# Patient Record
Sex: Male | Born: 2000 | Race: White | Hispanic: No | Marital: Single | Smoking: Never smoker
Health system: Southern US, Community
[De-identification: ages and names within clinical notes are randomized; demographics above are authoritative.]

## PROBLEM LIST (undated history)

## (undated) DIAGNOSIS — N200 Calculus of kidney: Secondary | ICD-10-CM

## (undated) DIAGNOSIS — F845 Asperger's syndrome: Secondary | ICD-10-CM

## (undated) HISTORY — PX: ATRIAL FIBRILLATION ABLATION: SHX5732

## (undated) HISTORY — DX: Asperger's syndrome: F84.5

## (undated) HISTORY — DX: Calculus of kidney: N20.0

---
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
## 2019-12-10 ENCOUNTER — Telehealth: Payer: Self-pay

## 2019-12-10 ENCOUNTER — Other Ambulatory Visit: Payer: Self-pay

## 2019-12-10 MED ORDER — ADAPALENE-BENZOYL PEROXIDE 0.1-2.5 % EX GEL: 1.0000 "application " | Freq: Every day | CUTANEOUS | 2 refills | Status: DC

## 2019-12-10 NOTE — Telephone Encounter (Signed)
Pt mom calling requesting refill of Epiduo Forte, patient away in college and he will be here for an appt in June 2021

## 2019-12-12 ENCOUNTER — Other Ambulatory Visit: Payer: Self-pay

## 2019-12-12 MED ORDER — EPIDUO FORTE 0.3-2.5 % EX GEL: 1.0000 "application " | Freq: Every day | CUTANEOUS | 3 refills | Status: DC

## 2020-03-09 ENCOUNTER — Ambulatory Visit: Payer: BLUE CROSS/BLUE SHIELD | Admitting: Dermatology

## 2020-03-22 ENCOUNTER — Other Ambulatory Visit: Payer: Self-pay | Admitting: Dermatology

## 2020-04-23 ENCOUNTER — Ambulatory Visit: Payer: BLUE CROSS/BLUE SHIELD | Admitting: Dermatology

## 2021-04-06 ENCOUNTER — Other Ambulatory Visit: Payer: Self-pay

## 2021-04-06 DIAGNOSIS — R109 Unspecified abdominal pain: Secondary | ICD-10-CM

## 2021-04-06 DIAGNOSIS — Z87442 Personal history of urinary calculi: Secondary | ICD-10-CM

## 2021-04-07 ENCOUNTER — Ambulatory Visit (INDEPENDENT_AMBULATORY_CARE_PROVIDER_SITE_OTHER): Payer: BC Managed Care – PPO | Admitting: Urology

## 2021-04-07 ENCOUNTER — Ambulatory Visit: Payer: Self-pay | Admitting: Urology

## 2021-04-07 ENCOUNTER — Telehealth: Payer: Self-pay | Admitting: Urology

## 2021-04-07 ENCOUNTER — Encounter: Payer: Self-pay | Admitting: Urology

## 2021-04-07 ENCOUNTER — Ambulatory Visit
Admission: RE | Admit: 2021-04-07 | Discharge: 2021-04-07 | Disposition: A | Discharge status: Home / Self Care | Payer: BC Managed Care – PPO | Source: Ambulatory Visit | Attending: Urology | Admitting: Urology

## 2021-04-07 ENCOUNTER — Other Ambulatory Visit: Payer: Self-pay

## 2021-04-07 VITALS — BP 121/82 | HR 81 | Ht 72.0 in | Wt 155.0 lb

## 2021-04-07 DIAGNOSIS — R1084 Generalized abdominal pain: Secondary | ICD-10-CM

## 2021-04-07 DIAGNOSIS — R109 Unspecified abdominal pain: Secondary | ICD-10-CM | POA: Diagnosis present

## 2021-04-07 DIAGNOSIS — Z87442 Personal history of urinary calculi: Secondary | ICD-10-CM

## 2021-04-07 LAB — URINALYSIS, COMPLETE
Bilirubin, UA: NEGATIVE
Glucose, UA: NEGATIVE
Ketones, UA: NEGATIVE
Leukocytes,UA: NEGATIVE
Nitrite, UA: NEGATIVE
Specific Gravity, UA: 1.03 (ref 1.005–1.030)
Urobilinogen, Ur: 0.2 mg/dL (ref 0.2–1.0)
pH, UA: 6 (ref 5.0–7.5)

## 2021-04-07 LAB — MICROSCOPIC EXAMINATION: RBC, Urine: >30 /hpf — AB (ref 0–2)

## 2021-04-07 NOTE — Telephone Encounter (Signed)
Pt's mother LMOM that pt saw Dr Valentina Lucks w/[facility] 3 yrs after stopping [facility].  She said she may have the number if we want to get records from them.

## 2021-04-07 NOTE — Progress Notes (Signed)
04/07/2021 1:25 PM   Richard Ball 2000-10-06 [id number]  Referring provider: No referring provider defined for this encounter.  Chief Complaint  Patient presents with   Hematuria    New Patient: hx of stones    HPI: 20 year old male who presents today with his mother for cute onset gross hematuria yesterday.  Notably, the patient has a complex urologic history as a pediatric patient.  At age 70, he had developed severe acute flank pain was found to have at least 4 kidney stones.  He was told that they were all small enough to pass but had one much larger.  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  He was followed for about 3 years and then ultimately released from urologic care.  His urologic history is somewhat unclear and records have been requested today from [facility] in [place] for further clarification.  Since then, he has had no further issues until yesterday.  Yesterday, he had blood-tinged urine on several occasions.  He may have had some associated flank pain, primarily right greater than left but this is very subtle.  He is currently in no acute distress.  No nausea or vomiting.  No fevers or chills.  No dysuria.   PMH: SVT Kidney stones  Surgical History: Cardiac ablation  Home Medications:  Allergies as of 04/07/2021   No Known Allergies      Medication List        Accurate as of April 07, 2021  1:25 PM. If you have any questions, ask your nurse or doctor.          STOP taking these medications    doxycycline 50 MG tablet Commonly known as: ADOXA Stopped by: Vanna Scotland, MD   Epiduo Forte 0.3-2.5 % Gel Generic drug: Adapalene-Benzoyl Peroxide Stopped by: Vanna Scotland, MD        Allergies: No Known Allergies  Family History: No family history on file.  Social History:  reports that he has never smoked. He has never used smokeless tobacco. He reports  previous alcohol use. No history on file for drug use.   Physical Exam: BP 121/82   Pulse 81   Ht 6' (1.829 m)   Wt 155 lb (70.3 kg)   BMI 21.02 kg/m   Constitutional:  Alert and oriented, No acute distress.  Accompanied by his mother today. HEENT: [place] AT, moist mucus membranes.  Trachea midline, no masses. Cardiovascular: No clubbing, cyanosis, or edema. Respiratory: Normal respiratory effort, no increased work of breathing. Skin: No rashes, bruises or suspicious lesions. Neurologic: Grossly intact, no focal deficits, moving all 4 extremities. Psychiatric: Normal mood and affect.    Urinalysis Urinalysis today with 6-10 WBCs, greater than 30 RBCs, moderate bacteria, protein but otherwise unremarkable.  Pertinent Imaging: KUB was ordered and personally reviewed today.  Radiologic interpretation is pending.  There is a spherical calcification overlying the right renal shadow which is very round in nature which may represent possibly a stone within a diverticulum or possibly within a calyx with stenotic infundibulum based on its appearance, alternatively could be outside of the kidney.  No ureteral, left-sided or pelvic calcifications appreciated.  Assessment & Plan:    1. History of kidney stones/ kidney stone Personal history of kidney stones at a young age, suspect some underlying renal anomaly possibly UPJ obstruction or calyceal diverticulum based on his mother's description  There is a calcification overlying his right  kidney which could possibly represent a stone within a diverticulum given his very round nature as well as location  Given these having gross hematuria, there is also possibility this having an acute stone event with a smaller stone not seen on KUB  At this point time after lengthy discussion, we did elect to go ahead and proceed with noncontrast CT scan.  Explained he is relatively young and would like to avoid serial CTs however this would help  establish/elucidate his anatomy as well as whether or not this represents a stone if he has another acute stone causing his gross hematuria event  Warning symptoms reviewed in the interim and indications for more urgent/emergent evaluation  We will call him with the CT results and follow-up plan thereafter  Lastly, records requested as outlined above - Urinalysis, Complete - CULTURE, URINE COMPREHENSIVE  2. Flank pain  Possibly secondary to #2; mild - CT RENAL STONE STUDY; Future  3. Gross hematuria Will send culture to rule out infection although not suspected  Likely secondary to #1  Will call with STAT CT results  Vanna Scotland, MD  [facility] [address] [place], [place] [address] [phone]

## 2021-04-08 ENCOUNTER — Telehealth: Payer: Self-pay | Admitting: Urology

## 2021-04-08 ENCOUNTER — Ambulatory Visit
Admission: RE | Admit: 2021-04-08 | Discharge: 2021-04-08 | Disposition: A | Discharge status: Home / Self Care | Payer: BC Managed Care – PPO | Source: Ambulatory Visit | Attending: Urology | Admitting: Urology

## 2021-04-08 DIAGNOSIS — R1084 Generalized abdominal pain: Secondary | ICD-10-CM | POA: Insufficient documentation

## 2021-04-08 NOTE — Telephone Encounter (Signed)
Patient's mother informed, scheduled follow up. Voiced understanding.

## 2021-04-08 NOTE — Telephone Encounter (Signed)
Confirm that his mother is on the [place].  Call or discuss CT scan findings.  Appears that he is passing interval left-sided 3 mm stone and this is likely the source of his hematuria.  Is minimal pain.  As such, would recommend conservative management.  He is got a stop by and pick up a urinary strainer either today or tomorrow.  Please arrange follow-up with me in 2 weeks for another x-ray as we can track the stone.  We discussed that it is important to try to catch the stone and send it for analysis, the stone is fairly dense at 1500 [place] units?  Cystinuria.  Vanna Scotland, MD

## 2021-04-08 NOTE — Telephone Encounter (Signed)
Faxed request to [facility]

## 2021-04-08 NOTE — Telephone Encounter (Signed)
Called patient's mother after confirming that she was on the [facility].  Unable to reach her, left message to return my call.  She is aware that I will be out of the office this afternoon and in and out tomorrow.  Vanna Scotland, MD

## 2021-04-08 NOTE — Telephone Encounter (Signed)
Pt's mother called asking if we have any results from CT scan.

## 2021-04-21 NOTE — Telephone Encounter (Signed)
Contacted [facility]-patient has not been seen at this office

## 2021-04-22 ENCOUNTER — Other Ambulatory Visit: Payer: Self-pay

## 2021-04-22 ENCOUNTER — Ambulatory Visit
Admission: RE | Admit: 2021-04-22 | Discharge: 2021-04-22 | Disposition: A | Discharge status: Home / Self Care | Payer: BC Managed Care – PPO | Source: Ambulatory Visit | Attending: Urology | Admitting: Urology

## 2021-04-22 ENCOUNTER — Ambulatory Visit
Admission: RE | Admit: 2021-04-22 | Discharge: 2021-04-22 | Disposition: A | Discharge status: Home / Self Care | Payer: BC Managed Care – PPO | Attending: Urology | Admitting: Urology

## 2021-04-22 ENCOUNTER — Ambulatory Visit (INDEPENDENT_AMBULATORY_CARE_PROVIDER_SITE_OTHER): Payer: BC Managed Care – PPO | Admitting: Urology

## 2021-04-22 ENCOUNTER — Encounter: Payer: Self-pay | Admitting: Urology

## 2021-04-22 VITALS — BP 118/83 | HR 80 | Ht 72.0 in | Wt 155.0 lb

## 2021-04-22 DIAGNOSIS — Z87442 Personal history of urinary calculi: Secondary | ICD-10-CM

## 2021-04-22 DIAGNOSIS — N201 Calculus of ureter: Secondary | ICD-10-CM | POA: Diagnosis not present

## 2021-04-22 DIAGNOSIS — N200 Calculus of kidney: Secondary | ICD-10-CM

## 2021-04-22 NOTE — Progress Notes (Signed)
04/22/2021 11:38 AM   Richard Ball Richard Ball May 07, 2001 [id number]  Referring provider: Tresa Res, MD [phone] [address],  [place] [address]  Chief Complaint  Patient presents with   Nephrolithiasis    HPI: 20 year old male with a personal history of nephrolithiasis who was seen and evaluated 2 weeks ago with acute onset hematuria.  Initially, a large stone was seen overlying the right kidney on KUB.  He was sent for urgent noncontrast CT scan which showed a 1.1 cm calculus overlying the right kidney a 3 mm calcification in the proximal third of the left ureter.  On retrospect, the stone could be seen occultly behind the transverse process on KUB.    Right renal calculus is chronic from his childhood.  He returns today for follow-up KUB.  He did pass a very small fragment but this is less than a millimeter.  He continues to have no pain or obvious gross hematuria.  Urinalysis today does have 2+ blood.   PMH: History reviewed. No pertinent past medical history.  Surgical History: Past Surgical History:  Procedure Laterality Date   ATRIAL FIBRILLATION ABLATION      Home Medications:  Allergies as of 04/22/2021   No Known Allergies      Medication List    as of April 22, 2021 11:38 AM   You have not been prescribed any medications.     Allergies: No Known Allergies  Family History: History reviewed. No pertinent family history.  Social History:  reports that he has never smoked. He has never used smokeless tobacco. He reports previous alcohol use. No history on file for drug use.   Physical Exam: BP 118/83   Pulse 80   Ht 6' (1.829 m)   Wt 155 lb (70.3 kg)   BMI 21.02 kg/m   Constitutional:  Alert and oriented, No acute distress. HEENT: [place] AT, moist mucus membranes.  Trachea midline, no masses. Cardiovascular: No clubbing, cyanosis, or edema. Respiratory: Normal respiratory effort, no increased work of breathing. Skin: No rashes, bruises or  suspicious lesions. Neurologic: Grossly intact, no focal deficits, moving all 4 extremities. Psychiatric: Normal mood and affect.  Pertinent Imaging: KUB from today was personally reviewed.  Final radiology interpretation is pending but the ureteral calculus on the left side in question is not easily seen today.  This was compared to his previous KUB from 2 weeks ago along with CT scan.  Right renal stone is stable.  Assessment & Plan:    1. 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  We will have her return again in 2 weeks with a urinalysis and continue to strain.  Warning symptoms reviewed again in detail. - Urinalysis, Complete  2. Right kidney stone Stable, nonobstructing, chronic  We discussed various treatment options for this including observation.  They prefer observation, would like to get a KUB again in 2 years and then possibly every 5 years thereafter to ensure stability.   F/u 2 weeks with KUB  Vanna Scotland, MD  [facility] [address] [place], [place] [address] [phone]  I spent 30 total minutes on the day of the encounter including pre-visit  review of the medical record, face-to-face time with the patient, and post visit ordering of labs/imaging/tests.

## 2021-04-23 LAB — URINALYSIS, COMPLETE
Bilirubin, UA: NEGATIVE
Glucose, UA: NEGATIVE
Ketones, UA: NEGATIVE
Leukocytes,UA: NEGATIVE
Nitrite, UA: NEGATIVE
Protein,UA: NEGATIVE
Specific Gravity, UA: >1.03 — ABNORMAL HIGH (ref 1.005–1.030)
Urobilinogen, Ur: 0.2 mg/dL (ref 0.2–1.0)
pH, UA: 5.5 (ref 5.0–7.5)

## 2021-04-23 LAB — MICROSCOPIC EXAMINATION: Bacteria, UA: NONE SEEN

## 2021-05-03 ENCOUNTER — Telehealth: Payer: Self-pay

## 2021-05-03 DIAGNOSIS — F845 Asperger's syndrome: Secondary | ICD-10-CM

## 2021-05-03 NOTE — Telephone Encounter (Signed)
Patient mother called stating pt has Asperger and just wanted to give you a "heads up" That he does not feel pain like others do and sometimes can not express his emotions. She states he is coming alone for his follow up and wanted to just let you know and as of today he has not passed his stone.

## 2021-05-04 MED ORDER — HYDROCODONE-ACETAMINOPHEN 5-325 MG PO TABS: 1.0000 | ORAL_TABLET | Freq: Four times a day (QID) | ORAL | 0 refills | Status: DC | PRN

## 2021-05-04 NOTE — Progress Notes (Signed)
05/05/21 9:13 AM   Richard Ball 06-14-2001 [id number]  Referring provider:  Tresa Res, MD [phone] [address],  [place] [address] Chief Complaint  Patient presents with   Nephrolithiasis     HPI: Richard Ball is a 20 y.o.male  who has a personal history of nephrolithiasis and presents today for a 2 week follow-up UA.   His 04/22/2021 KUB showed stable 12 mm right renal calculus. No other urinary tract calculi. The left retroperitoneal calcifications seen on the preceding CT scan was not visualized on this study.  Initially, a large stone was seen overlying the right kidney on KUB.  He was sent for urgent noncontrast CT scan which showed a 1.1 cm calculus overlying the right kidney a 3 mm calcification in the proximal third of the left ureter.  On retrospect, the stone could be seen occultly behind the transverse process on KUB.  Right renal calculus is chronic from his childhood.   Patients xray of abdomen showed  a stone today on his left UVJ.  Patient is accompanied by his mother.   Patients mom states that he has been experiencing nausea and vomiting acute pain for the last 24 hours.  Patient states has been experiencing pain at a scale of 7-8.   Prior to yesterday, had not been having any pain with a stone, only hematuria.    PMH: Past Medical History:  Diagnosis Date   Asperger syndrome    Kidney stones     Surgical History: Past Surgical History:  Procedure Laterality Date   ATRIAL FIBRILLATION ABLATION      Home Medications:  Allergies as of 05/05/2021   No Known Allergies      Medication List        Accurate as of May 05, 2021  9:13 AM. If you have any questions, ask your nurse or doctor.          HYDROcodone-acetaminophen 5-325 MG tablet Commonly known as: NORCO/VICODIN Take 1-2 tablets by mouth every 6 (six) hours as needed for moderate pain.        Allergies: No Known Allergies  Family History: History  reviewed. No pertinent family history.  Social History:  reports that he has never smoked. He has never used smokeless tobacco. He reports previous alcohol use. No history on file for drug use.   Physical Exam: BP 134/86   Pulse 80   Ht 6' (1.829 m)   Wt 155 lb (70.3 kg)   BMI 21.02 kg/m   Constitutional:  Alert and oriented, appears uncomfortable in a wheelchair today.  Distinct change from previous visits. HEENT: [place] AT, moist mucus membranes.  Trachea midline, no masses. Cardiovascular: No clubbing, cyanosis, or edema. Respiratory: Normal respiratory effort, no increased work of breathing. Skin: No rashes, bruises or suspicious lesions. Neurologic: Grossly intact, no focal deficits, moving all 4 extremities. Psychiatric: Normal mood and affect.  Urinalysis Urinalysis today was unremarkable  Pertinent Imaging: KUB personally reviewed today, 3 mm stone is now on the distal ureter/located adjacent to the UVJ.  Assessment & Plan:    Left ureteral stone - Stone is in the left distal ureter - interval progression of the stone in the distal ureter but now its significantly uncontrolled pain.   We discussed various treatment options for urolithiasis including observation with or without medical expulsive therapy, shockwave lithotripsy (SWL), ureteroscopy and laser lithotripsy with stent placement, and percutaneous nephrolithotomy.   We discussed that management is based on stone size, location,  density, patient co-morbidities, and patient preference.    Stones <90mm in size have a >80% spontaneous passage rate. Data surrounding the use of tamsulosin for medical expulsive therapy is controversial, but meta analyses suggests it is most efficacious for distal stones between 5-10mm in size. Possible side effects include dizziness/lightheadedness, and retrograde ejaculation.   SWL has a lower stone free rate in a single procedure, but also a lower complication rate compared to ureteroscopy  and avoids a stent and associated stent related symptoms. Possible complications include renal hematoma, steinstrasse, and need for additional treatment. We discussed the role of his increased skin to stone distance can lead to decreased efficacy with shockwave lithotripsy.   Ureteroscopy with laser lithotripsy and stent placement has a higher stone free rate than SWL in a single procedure, however increased complication rate including possible infection, ureteral injury, bleeding, and stent related morbidity. Common stent related symptoms include dysuria, urgency/frequency, and flank pain.   After an extensive discussion of the risks and benefits of the above treatment options, the patient would like to proceed with left ESWL.   2. Nausea  - He has been experiencing nausea and vomiting, placed him on Zofran   Follow-up with ESWL   I,Kailey Littlejohn,acting as a scribe for Vanna Scotland, MD.,have documented all relevant documentation on the behalf of Vanna Scotland, MD,as directed by  Vanna Scotland, MD while in the presence of Vanna Scotland, MD.   [facility] [address], [address] [place], [place] [address] [phone]

## 2021-05-04 NOTE — Telephone Encounter (Signed)
Patient's mother left message on triage line that he is in a lot of pain and is sitting in her bathtub trying to pass the stone. She is wanting to know if there is anything stronger than Ibuprofen than can be sent in for the pain?

## 2021-05-04 NOTE — Telephone Encounter (Signed)
Prescription for Vicodin was sent to pharmacy  Vanna Scotland, MD

## 2021-05-04 NOTE — Telephone Encounter (Signed)
Patient's mother aware-voiced understanding.

## 2021-05-04 NOTE — H&P (View-Only) (Signed)
 05/05/21 9:13 AM   Richard Ball 06/29/2001 [id number]  Referring provider:  Johnson, David S, MD [address] [place],  [place] [address] Chief Complaint  Patient presents with   Nephrolithiasis     HPI: Richard Ball is a 20 y.o.male  who has a personal history of nephrolithiasis and presents today for a 2 week follow-up UA.   His 04/22/2021 KUB showed stable 12 mm right renal calculus. No other urinary tract calculi. The left retroperitoneal calcifications seen on the preceding CT scan was not visualized on this study.  Initially, a large stone was seen overlying the right kidney on KUB.  He was sent for urgent noncontrast CT scan which showed a 1.1 cm calculus overlying the right kidney a 3 mm calcification in the proximal third of the left ureter.  On retrospect, the stone could be seen occultly behind the transverse process on KUB.  Right renal calculus is chronic from his childhood.   Patients xray of abdomen showed  a stone today on his left UVJ.  Patient is accompanied by his mother.   Patients mom states that he has been experiencing nausea and vomiting acute pain for the last 24 hours.  Patient states has been experiencing pain at a scale of 7-8.   Prior to yesterday, had not been having any pain with a stone, only hematuria.    PMH: Past Medical History:  Diagnosis Date   Asperger syndrome    Kidney stones     Surgical History: Past Surgical History:  Procedure Laterality Date   ATRIAL FIBRILLATION ABLATION      Home Medications:  Allergies as of 05/05/2021   No Known Allergies      Medication List        Accurate as of May 05, 2021  9:13 AM. If you have any questions, ask your nurse or doctor.          HYDROcodone-acetaminophen 5-325 MG tablet Commonly known as: NORCO/VICODIN Take 1-2 tablets by mouth every 6 (six) hours as needed for moderate pain.        Allergies: No Known Allergies  Family History: History  reviewed. No pertinent family history.  Social History:  reports that he has never smoked. He has never used smokeless tobacco. He reports previous alcohol use. No history on file for drug use.   Physical Exam: BP 134/86   Pulse 80   Ht 6' (1.829 m)   Wt 155 lb (70.3 kg)   BMI 21.02 kg/m   Constitutional:  Alert and oriented, appears uncomfortable in a wheelchair today.  Distinct change from previous visits. HEENT:  AT, moist mucus membranes.  Trachea midline, no masses. Cardiovascular: No clubbing, cyanosis, or edema. Respiratory: Normal respiratory effort, no increased work of breathing. Skin: No rashes, bruises or suspicious lesions. Neurologic: Grossly intact, no focal deficits, moving all 4 extremities. Psychiatric: Normal mood and affect.  Urinalysis Urinalysis today was unremarkable  Pertinent Imaging: KUB personally reviewed today, 3 mm stone is now on the distal ureter/located adjacent to the UVJ.  Assessment & Plan:    Left ureteral stone - Stone is in the left distal ureter - interval progression of the stone in the distal ureter but now its significantly uncontrolled pain.   We discussed various treatment options for urolithiasis including observation with or without medical expulsive therapy, shockwave lithotripsy (SWL), ureteroscopy and laser lithotripsy with stent placement, and percutaneous nephrolithotomy.   We discussed that management is based on stone size, location,   density, patient co-morbidities, and patient preference.    Stones <90mm in size have a >80% spontaneous passage rate. Data surrounding the use of tamsulosin for medical expulsive therapy is controversial, but meta analyses suggests it is most efficacious for distal stones between 5-10mm in size. Possible side effects include dizziness/lightheadedness, and retrograde ejaculation.   SWL has a lower stone free rate in a single procedure, but also a lower complication rate compared to ureteroscopy  and avoids a stent and associated stent related symptoms. Possible complications include renal hematoma, steinstrasse, and need for additional treatment. We discussed the role of his increased skin to stone distance can lead to decreased efficacy with shockwave lithotripsy.   Ureteroscopy with laser lithotripsy and stent placement has a higher stone free rate than SWL in a single procedure, however increased complication rate including possible infection, ureteral injury, bleeding, and stent related morbidity. Common stent related symptoms include dysuria, urgency/frequency, and flank pain.   After an extensive discussion of the risks and benefits of the above treatment options, the patient would like to proceed with left ESWL.   2. Nausea  - He has been experiencing nausea and vomiting, placed him on Zofran   Follow-up with ESWL   I,Kailey Littlejohn,acting as a scribe for Vanna Scotland, MD.,have documented all relevant documentation on the behalf of Vanna Scotland, MD,as directed by  Vanna Scotland, MD while in the presence of Vanna Scotland, MD.   [facility] [address], [address] [place], [place] [address] [phone]

## 2021-05-05 ENCOUNTER — Ambulatory Visit
Admission: RE | Admit: 2021-05-05 | Discharge: 2021-05-05 | Disposition: A | Discharge status: Home / Self Care | Payer: BC Managed Care – PPO | Source: Ambulatory Visit | Attending: Urology | Admitting: Urology

## 2021-05-05 ENCOUNTER — Other Ambulatory Visit: Payer: Self-pay | Admitting: Urology

## 2021-05-05 ENCOUNTER — Encounter: Payer: Self-pay | Admitting: Urology

## 2021-05-05 ENCOUNTER — Ambulatory Visit: Payer: BC Managed Care – PPO | Admitting: Urology

## 2021-05-05 ENCOUNTER — Other Ambulatory Visit: Payer: Self-pay

## 2021-05-05 VITALS — BP 134/86 | HR 80 | Ht 72.0 in | Wt 155.0 lb

## 2021-05-05 DIAGNOSIS — N201 Calculus of ureter: Secondary | ICD-10-CM

## 2021-05-05 DIAGNOSIS — Z87442 Personal history of urinary calculi: Secondary | ICD-10-CM

## 2021-05-05 DIAGNOSIS — R110 Nausea: Secondary | ICD-10-CM | POA: Diagnosis not present

## 2021-05-05 DIAGNOSIS — N200 Calculus of kidney: Secondary | ICD-10-CM

## 2021-05-05 LAB — URINALYSIS, COMPLETE
Bilirubin, UA: NEGATIVE
Glucose, UA: NEGATIVE
Leukocytes,UA: NEGATIVE
Nitrite, UA: NEGATIVE
Protein,UA: NEGATIVE
Specific Gravity, UA: 1.005 (ref 1.005–1.030)
Urobilinogen, Ur: 0.2 mg/dL (ref 0.2–1.0)
pH, UA: 6.5 (ref 5.0–7.5)

## 2021-05-05 LAB — MICROSCOPIC EXAMINATION: Bacteria, UA: NONE SEEN

## 2021-05-05 MED ORDER — ONDANSETRON HCL 4 MG PO TABS: 4.0000 mg | ORAL_TABLET | Freq: Three times a day (TID) | ORAL | 0 refills | Status: DC | PRN

## 2021-05-05 MED ORDER — CEPHALEXIN 250 MG PO CAPS: 500.0000 mg | ORAL_CAPSULE | ORAL | Status: AC

## 2021-05-06 ENCOUNTER — Ambulatory Visit: Payer: BC Managed Care – PPO

## 2021-05-06 ENCOUNTER — Ambulatory Visit
Admission: RE | Admit: 2021-05-06 | Discharge: 2021-05-06 | Disposition: A | Discharge status: Home / Self Care | Payer: BC Managed Care – PPO | Attending: Urology | Admitting: Urology

## 2021-05-06 ENCOUNTER — Encounter
Admission: RE | Disposition: A | Discharge status: Home / Self Care | Payer: Self-pay | Source: Home / Self Care | Attending: Urology

## 2021-05-06 ENCOUNTER — Encounter: Payer: Self-pay | Admitting: Urology

## 2021-05-06 DIAGNOSIS — J45909 Unspecified asthma, uncomplicated: Secondary | ICD-10-CM | POA: Insufficient documentation

## 2021-05-06 DIAGNOSIS — N201 Calculus of ureter: Secondary | ICD-10-CM | POA: Insufficient documentation

## 2021-05-06 DIAGNOSIS — N200 Calculus of kidney: Secondary | ICD-10-CM

## 2021-05-06 HISTORY — PX: EXTRACORPOREAL SHOCK WAVE LITHOTRIPSY: SHX1557

## 2021-05-06 SURGERY — LITHOTRIPSY, ESWL
Anesthesia: Moderate Sedation | Laterality: Left

## 2021-05-06 MED ORDER — DIAZEPAM 5 MG PO TABS
ORAL_TABLET | ORAL | Status: AC
  Administered 2021-05-06: 10 mg via ORAL
  Filled 2021-05-06: qty 2

## 2021-05-06 MED ORDER — CEPHALEXIN 500 MG PO CAPS: 500.0000 mg | ORAL_CAPSULE | Freq: Once | ORAL | Status: AC

## 2021-05-06 MED ORDER — DIPHENHYDRAMINE HCL 25 MG PO CAPS: 25.0000 mg | ORAL_CAPSULE | ORAL | Status: AC

## 2021-05-06 MED ORDER — TAMSULOSIN HCL 0.4 MG PO CAPS: 0.4000 mg | ORAL_CAPSULE | Freq: Every day | ORAL | 0 refills | Status: DC

## 2021-05-06 MED ORDER — CEPHALEXIN 500 MG PO CAPS
ORAL_CAPSULE | ORAL | Status: AC
  Administered 2021-05-06: 500 mg via ORAL
  Filled 2021-05-06: qty 1

## 2021-05-06 MED ORDER — SODIUM CHLORIDE 0.9 % IV SOLN: INTRAVENOUS | Status: DC

## 2021-05-06 MED ORDER — DIAZEPAM 5 MG PO TABS: 10.0000 mg | ORAL_TABLET | ORAL | Status: AC

## 2021-05-06 MED ORDER — ONDANSETRON HCL 4 MG/2ML IJ SOLN: 4.0000 mg | Freq: Once | INTRAMUSCULAR | Status: AC | PRN

## 2021-05-06 MED ORDER — ONDANSETRON HCL 4 MG/2ML IJ SOLN
INTRAMUSCULAR | Status: AC
  Administered 2021-05-06: 4 mg via INTRAVENOUS
  Filled 2021-05-06: qty 2

## 2021-05-06 MED ORDER — DIPHENHYDRAMINE HCL 25 MG PO CAPS
ORAL_CAPSULE | ORAL | Status: AC
  Administered 2021-05-06: 25 mg via ORAL
  Filled 2021-05-06: qty 1

## 2021-05-06 NOTE — Discharge Instructions (Addendum)
  AMBULATORY SURGERY  DISCHARGE INSTRUCTIONS   The drugs that you were given will stay in your system until tomorrow so for the next 24 hours you should not:  Drive an automobile Make any legal decisions Drink any alcoholic beverage   You may resume regular meals tomorrow.  Today it is better to start with liquids and gradually work up to solid foods.  You may eat anything you prefer, but it is better to start with liquids, then soup and crackers, and gradually work up to solid foods.   Please notify your doctor immediately if you have any unusual bleeding, trouble breathing, redness and pain at the surgery site, drainage, fever, or pain not relieved by medication.    Additional Instructions:        Please contact your physician with any problems or Same Day Surgery at [phone], Monday through Friday 6 am to 4 pm, or [place] at [facility] number at [phone].   Discharge instructions:  As per the [facility] discharge instructions A prescription for tamsulosin was sent to your pharmacy which will help pass stone fragments Call [facility] [phone] for pain not controlled with oral medication or development of fever greater than 101 degrees Follow-up appointment scheduled 05/27/2021

## 2021-05-07 ENCOUNTER — Telehealth: Payer: Self-pay

## 2021-05-07 MED ORDER — HYDROCODONE-ACETAMINOPHEN 5-325 MG PO TABS: 1.0000 | ORAL_TABLET | Freq: Four times a day (QID) | ORAL | 0 refills | Status: DC | PRN

## 2021-05-07 NOTE — Addendum Note (Signed)
Addended by: Riki Altes on: 05/07/2021 09:44 AM   Modules accepted: Orders

## 2021-05-07 NOTE — Telephone Encounter (Signed)
Pt mother called back asking for a few more hydrocodone. Please advise

## 2021-05-07 NOTE — Telephone Encounter (Signed)
Pt mother aware.

## 2021-05-07 NOTE — Telephone Encounter (Signed)
Pt mother called stating he is having discomfort. Advised her it is ok to alternate tylenol and advil and to start flomax. Pt mother voiced understanding

## 2021-05-08 NOTE — Interval H&P Note (Signed)
History and Physical Interval Note:  05/08/2021 10:37 AM  Richard Ball  has presented today for surgery, with the diagnosis of Kidney stone.  The various methods of treatment have been discussed with the patient and family. After consideration of risks, benefits and other options for treatment, the patient has consented to  Procedure(s): EXTRACORPOREAL SHOCK WAVE LITHOTRIPSY (ESWL) (Left) as a surgical intervention.  The patient's history has been reviewed, patient examined, no change in status, stable for surgery.  I have reviewed the patient's chart and labs.  Questions were answered to the patient's satisfaction.     Boleslaw Borghi C Vendetta Pittinger

## 2021-05-10 ENCOUNTER — Telehealth: Payer: Self-pay

## 2021-05-10 NOTE — Telephone Encounter (Signed)
Patient's mom called to notify us that patient has passed and collected some small sand like stone fragments. She wanted to know if these would be sent off for analysis, she was instructed to have him bring into post op appointment and the physician will determine then if they should be sent off. He has been also passing small blood clots. This can happen with stone passage, he was encouraged to continue tamsulosin while passing fragments to help, along with increased water intake. Patient's mom also states that patient experienced an episode of retrograde ejaculation recently. It was explained that this can be a side effect of the Tamsulosin and is not harmful. Patient will follow up as scheduled

## 2021-05-11 ENCOUNTER — Ambulatory Visit: Payer: Self-pay | Admitting: Urology

## 2021-05-13 ENCOUNTER — Encounter: Payer: Self-pay | Admitting: Urology

## 2021-05-20 ENCOUNTER — Other Ambulatory Visit: Payer: Self-pay | Admitting: *Deleted

## 2021-05-20 DIAGNOSIS — N200 Calculus of kidney: Secondary | ICD-10-CM

## 2021-05-27 ENCOUNTER — Ambulatory Visit: Payer: BC Managed Care – PPO | Admitting: Urology

## 2021-06-03 NOTE — Progress Notes (Signed)
06/04/2021 4:29 PM   Richard Ball Richard Ball 2001-02-05 [id number]  Referring provider: Tresa Res, MD [phone] [address],  [place] [address]  Urological history: 1. Nephrolithiasis -first stone occurrence at age 20  -persistent right renal 1 cm stone on KUB  Chief Complaint  Patient presents with   Nephrolithiasis    HPI: Richard Ball is a 20 y.o. who is status post ESWL who presents today for follow up.  Underwent ESWL on 05/06/2021 for a left UVJ stone with Dr. Lonna Cobb.  Their postprocedural course was as expected and uneventful.   They have passed fragments.   They bring in fragments for analysis.   KUB stone no longer seen.    PMH: Past Medical History:  Diagnosis Date   Asperger syndrome    Kidney stones     Surgical History: Past Surgical History:  Procedure Laterality Date   ATRIAL FIBRILLATION ABLATION     EXTRACORPOREAL SHOCK WAVE LITHOTRIPSY Left 05/06/2021   Procedure: EXTRACORPOREAL SHOCK WAVE LITHOTRIPSY (ESWL);  Surgeon: Riki Altes, MD;  Location: [facility] ORS;  Service: Urology;  Laterality: Left;    Home Medications:  No current outpatient medications on file prior to visit.   No current facility-administered medications on file prior to visit.    Allergies: No Known Allergies  Family History: No family history on file.  Social History:  reports that he has never smoked. He has never used smokeless tobacco. He reports that he does not currently use alcohol. No history on file for drug use.  ROS: Pertinent ROS in HPI  Physical Exam: BP 123/87   Pulse 79   Ht 6' (1.829 m)   Wt 155 lb (70.3 kg)   BMI 21.02 kg/m   Constitutional:  Well nourished. Alert and oriented, No acute distress. HEENT: [place] AT, mask in place.   Trachea midline. Cardiovascular: No clubbing, cyanosis, or edema. Respiratory: Normal respiratory effort, no increased work of breathing. Neurologic: Grossly intact, no focal deficits, moving all 4  extremities. Psychiatric: Normal mood and affect.  Laboratory Data: Urinalysis    Component Value Date/Time   APPEARANCEUR Clear 05/05/2021 0831   GLUCOSEU Negative 05/05/2021 0831   BILIRUBINUR Negative 05/05/2021 0831   PROTEINUR Negative 05/05/2021 0831   NITRITE Negative 05/05/2021 0831   LEUKOCYTESUR Negative 05/05/2021 0831  I have reviewed the labs.   Pertinent Imaging: Narrative & Impression  CLINICAL DATA:  History of right renal stone.  Follow-up.   EXAM: ABDOMEN - 1 VIEW   COMPARISON:  05/06/2021.  CT 04/08/2021.   FINDINGS: Bowel gas pattern is normal. 11 mm stone within the right kidney appears unchanged. No evidence of passing stone. Few small phleboliths in the right pelvis. No significant bone finding.   IMPRESSION: 11 mm stone within the right kidney, unchanged since prior exams.     Electronically Signed   By: Paulina Fusi M.D.   On: 06/07/2021 13:30   I have independently reviewed the films.    Assessment & Plan:    1. Left ureteral stone - stone sent for analysis  - given ABC's of stone prevention booklet - discussed metabolic workup and will talk it over with his parents prior to proceeding   Return in about 2 years (around 06/05/2023) for KUB.  These notes generated with voice recognition software. I apologize for typographical errors.  Michiel Cowboy, PA-C  [facility] [address]  [address] [place], [place] [address] [phone]

## 2021-06-04 ENCOUNTER — Other Ambulatory Visit: Payer: Self-pay

## 2021-06-04 ENCOUNTER — Ambulatory Visit (INDEPENDENT_AMBULATORY_CARE_PROVIDER_SITE_OTHER): Payer: BC Managed Care – PPO | Admitting: Urology

## 2021-06-04 ENCOUNTER — Ambulatory Visit
Admission: RE | Admit: 2021-06-04 | Discharge: 2021-06-04 | Disposition: A | Discharge status: Home / Self Care | Payer: BC Managed Care – PPO | Source: Ambulatory Visit | Attending: Urology | Admitting: Urology

## 2021-06-04 ENCOUNTER — Encounter: Payer: Self-pay | Admitting: Urology

## 2021-06-04 VITALS — BP 123/87 | HR 79 | Ht 72.0 in | Wt 155.0 lb

## 2021-06-04 DIAGNOSIS — N200 Calculus of kidney: Secondary | ICD-10-CM | POA: Insufficient documentation

## 2021-06-04 DIAGNOSIS — N201 Calculus of ureter: Secondary | ICD-10-CM

## 2021-06-11 LAB — CALCULI, WITH PHOTOGRAPH (CLINICAL LAB)
Calcium Oxalate Monohydrate: 20 %
Hydroxyapatite: 80 %
Weight Calculi: 12 mg

## 2021-07-05 ENCOUNTER — Other Ambulatory Visit: Payer: Self-pay

## 2021-07-05 ENCOUNTER — Other Ambulatory Visit: Payer: BC Managed Care – PPO

## 2021-07-14 ENCOUNTER — Other Ambulatory Visit: Payer: Self-pay | Admitting: Urology

## 2021-07-15 ENCOUNTER — Telehealth: Payer: Self-pay | Admitting: Urology

## 2021-07-15 NOTE — Telephone Encounter (Signed)
LEFT MESSAGE FOR DAD TO CALL ME BACK TO SCHEDULE UDS RESULTS APP WITH SHANNON.  MICHELLE

## 2021-07-19 ENCOUNTER — Telehealth: Payer: Self-pay | Admitting: Family Medicine

## 2021-07-19 NOTE — Telephone Encounter (Addendum)
Michiel Cowboy A, PA-C  You 20 minutes ago (8:40 AM)   Marcelino Duster had left a message for him to schedule an appointment.  We need to do additional labs, so it cannot be done over the phone.      Spoke to Ila and scheduled appointment.

## 2021-07-19 NOTE — Telephone Encounter (Signed)
Patient's mother Marylene Land called and states she has the 24 hour urine result and would like to know what he may need to start doing to try to prevent stones. Please call Marylene Land at [phone]

## 2021-08-02 ENCOUNTER — Ambulatory Visit: Payer: BC Managed Care – PPO | Admitting: Urology

## 2021-08-17 NOTE — Progress Notes (Signed)
    08/18/2021 1:09 PM   Richard Ball December 25, 2000 [id number]  Referring provider: Tresa Res, MD [phone] S. [address],  [place] [address]  Chief Complaint  Patient presents with   Results   Urological history: 1. Nephrolithiasis -stone composition 80% hydroxyapatite and 20% calcium oxalate monohydrate -first stone occurrence at age 20  -persistent right renal 1 cm stone on KUB  HPI: Richard Ball is a 20 y.o. male who presents today to discuss his 24 hour urine culture results.  Results reveal: -sub optimal urine volume -hypercalciuria -high urine pH -mild CaOx stone risk  -extreme CaP stone risk      PMH: Past Medical History:  Diagnosis Date   Asperger syndrome    Kidney stones     Surgical History: Past Surgical History:  Procedure Laterality Date   ATRIAL FIBRILLATION ABLATION     EXTRACORPOREAL SHOCK WAVE LITHOTRIPSY Left 05/06/2021   Procedure: EXTRACORPOREAL SHOCK WAVE LITHOTRIPSY (ESWL);  Surgeon: Riki Altes, MD;  Location: [facility] OR[facility];  Service: Urology;  Laterality: Left;    Home Medications:  Allergies as of 08/18/2021   No Known Allergies      Medication List    as of August 18, 2021 11:59 PM   You have not been prescribed any medications.     Allergies: No Known Allergies  Family History: No family history on file.  Social History:  reports that he has never smoked. He has never used smokeless tobacco. He reports that he does not currently use alcohol. No history on file for drug use.  ROS: Pertinent ROS in HPI  Physical Exam: BP 118/85   Pulse 69   Ht 6' (1.829 m)   Wt 154 lb (69.9 kg)   BMI 20.89 kg/m   Constitutional:  Well nourished. Alert and oriented, No acute distress. HEENT: [place] AT, mask in place.  Trachea midline Cardiovascular: No clubbing, cyanosis, or edema. Respiratory: Normal respiratory effort, no increased work of breathing. Neurologic: Grossly intact, no focal deficits, moving all 4  extremities. Psychiatric: Normal mood and affect.  Laboratory Data: N/A  Pertinent Imaging: N/A  Assessment & Plan:    1. Nephrolithiasis -discussed Litholink results -recommend to increase water intake to at least a gallon of water daily  -Due to evidence of hypercalciuria we will check a parathyroid hormone level, basic metabolic panel, TSH and vitamin D level -Advised to keep diet sodium level between 2300 to 3500 mg a day -Advised to consume foods with calcium so that he may get 1200 mg a day in divided meals -Reduce protein to 56 grams day  Return for pending blood work results .  These notes generated with voice recognition software. I apologize for typographical errors.  Michiel Cowboy, PA-C  [facility] [address]  [address] [place], [place] [address] [phone]   I spent 15 minutes on the day of the encounter to include pre-visit record review, face-to-face time with the patient, and post-visit ordering of tests.

## 2021-08-18 ENCOUNTER — Ambulatory Visit (INDEPENDENT_AMBULATORY_CARE_PROVIDER_SITE_OTHER): Payer: BC Managed Care – PPO | Admitting: Urology

## 2021-08-18 ENCOUNTER — Other Ambulatory Visit: Payer: Self-pay

## 2021-08-18 ENCOUNTER — Encounter: Payer: Self-pay | Admitting: Urology

## 2021-08-18 VITALS — BP 118/85 | HR 69 | Ht 72.0 in | Wt 154.0 lb

## 2021-08-18 DIAGNOSIS — N201 Calculus of ureter: Secondary | ICD-10-CM | POA: Diagnosis not present

## 2021-08-18 NOTE — Patient Instructions (Signed)
Foods high in  malic acid red wine. green tea. avocado. blackcurrant. guava. mango. mulberry. pomegranate.

## 2021-08-19 LAB — PTH, INTACT AND CALCIUM: PTH: 17 pg/mL (ref 15–65)

## 2021-08-19 LAB — BASIC METABOLIC PANEL
BUN/Creatinine Ratio: 15 (ref 9–20)
BUN: 14 mg/dL (ref 6–20)
CO2: 23 mmol/L (ref 20–29)
Calcium: 9.8 mg/dL (ref 8.7–10.2)
Chloride: 103 mmol/L (ref 96–106)
Creatinine, Ser: 0.94 mg/dL (ref 0.76–1.27)
Glucose: 89 mg/dL (ref 70–99)
Potassium: 4.4 mmol/L (ref 3.5–5.2)
Sodium: 142 mmol/L (ref 134–144)
eGFR: 118 mL/min/{1.73_m2} (ref >59)

## 2021-08-19 LAB — TSH: TSH: 1.04 u[IU]/mL (ref 0.450–4.500)

## 2021-08-19 LAB — VITAMIN D 25 HYDROXY (VIT D DEFICIENCY, FRACTURES): Vit D, 25-Hydroxy: 26.3 ng/mL — ABNORMAL LOW (ref 30.0–100.0)

## 2023-01-15 IMAGING — CR DG ABDOMEN 1V
1 series · 2 of 2 positions shown · non-contrast
Comparison: Abdomen and pelvis CT dated 05/15/2005.

CLINICAL DATA: Flank pain at the time the examination was ordered.
The patient reports no current pain. Gross hematuria. History of
kidney stones.

EXAM:
ABDOMEN - 1 VIEW

[Series 1: dg abd 1 view · 0.14mm/px · 2 of 2 slices shown]
[im 1/2]
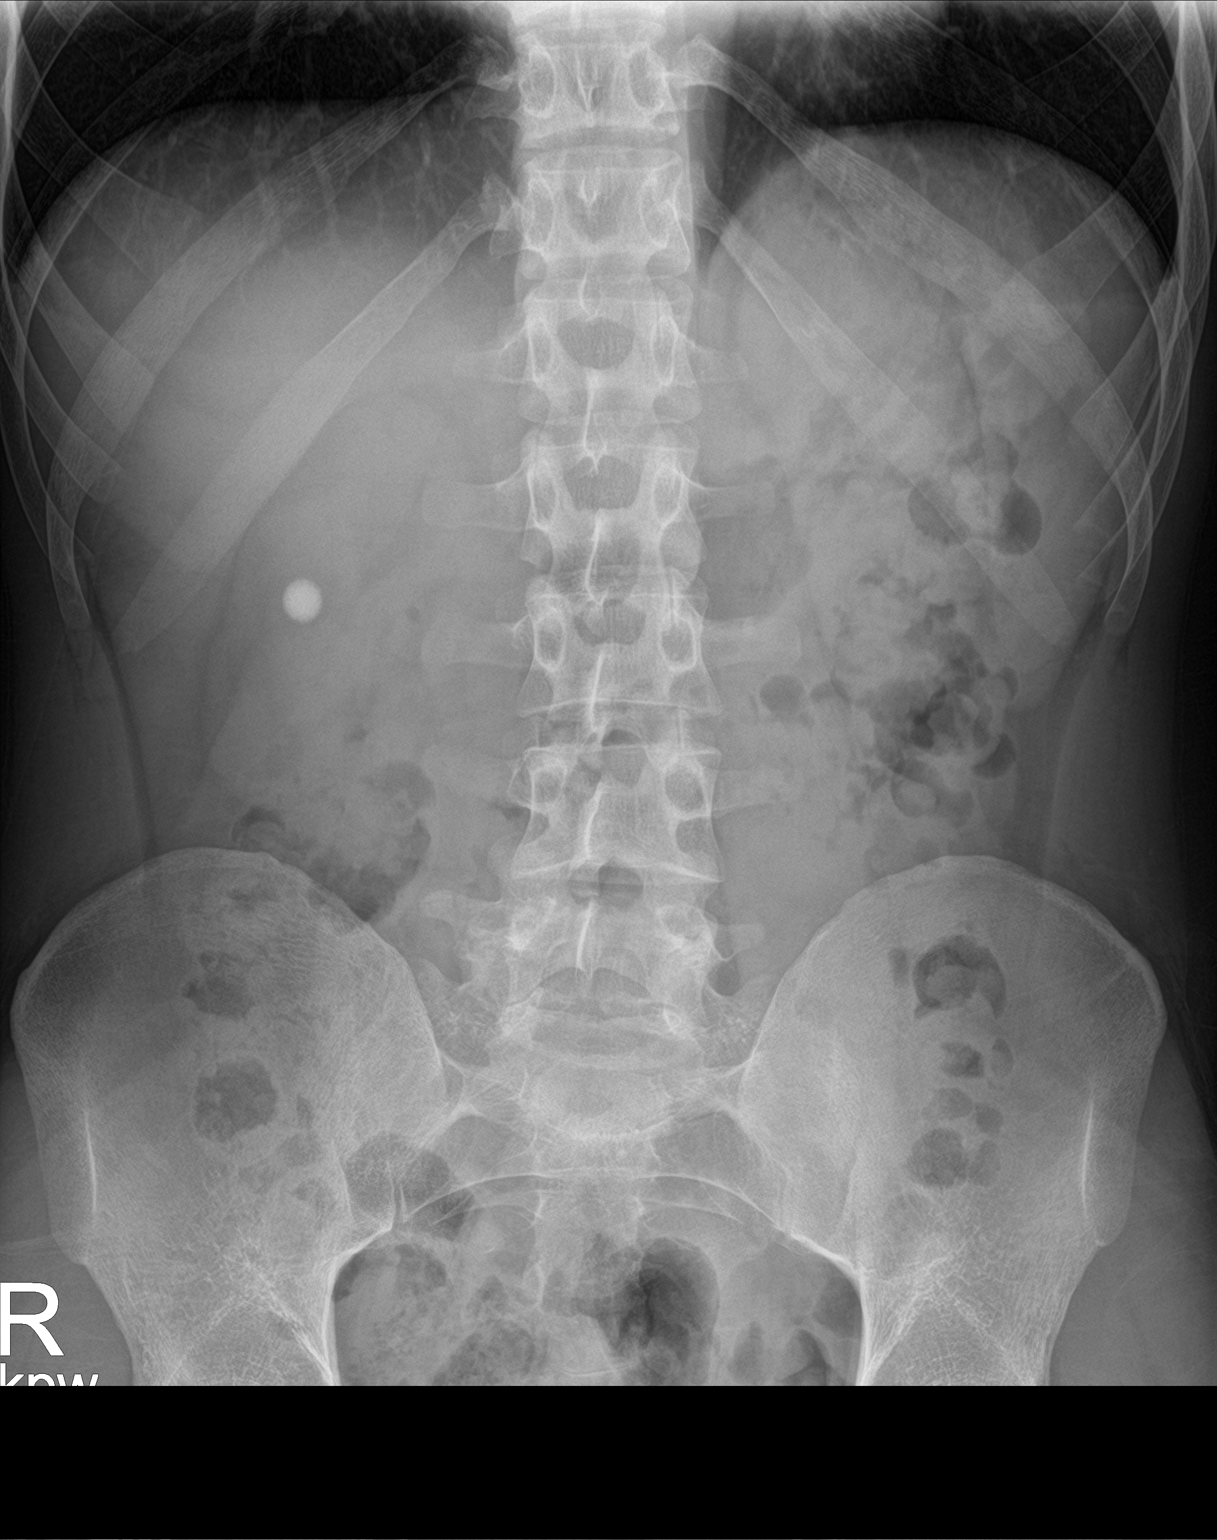
[im 2/2]
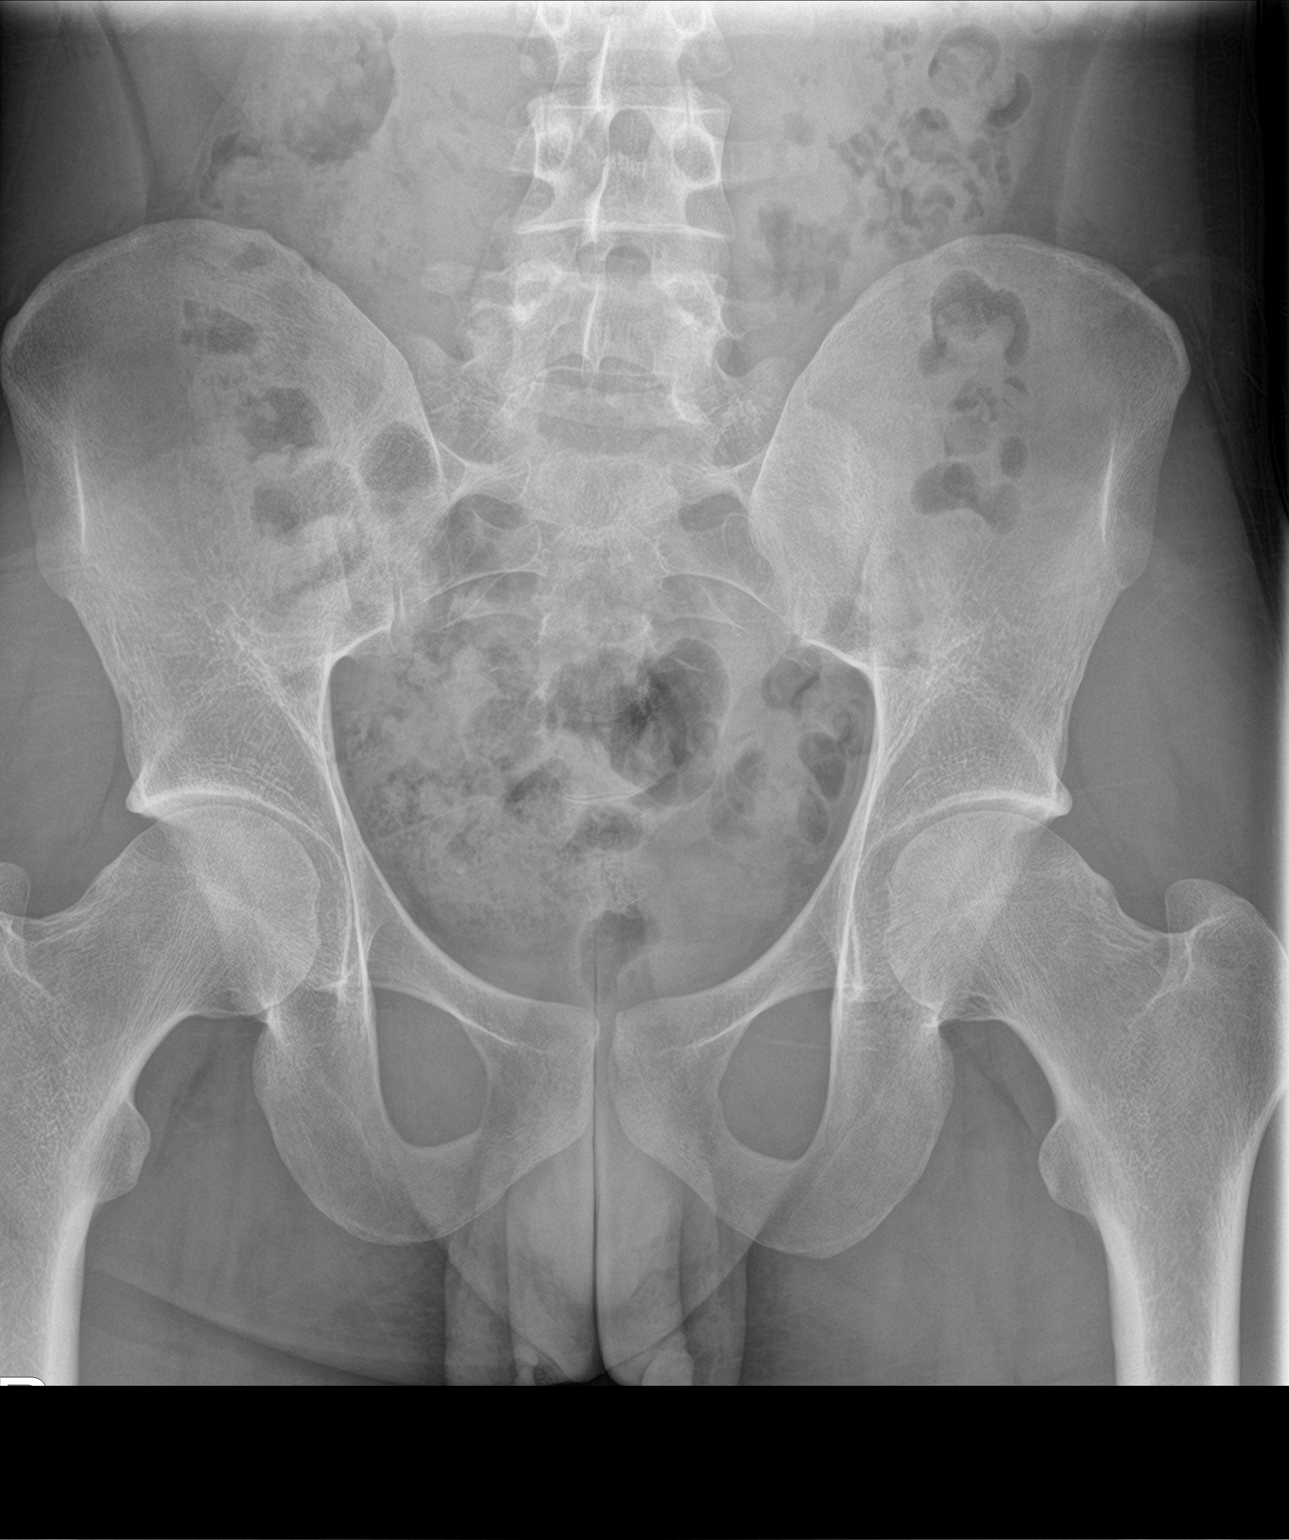

[2 of 2 positions shown; findings below may reference images not displayed]

FINDINGS: The previously demonstrated mid right renal calculus is larger,
currently measuring 1.2 cm. This previously measured 5 mm. No other
calcified urinary tract calculi seen.

Normal bowel gas pattern. Mildly prominent stool throughout the
colon. Unremarkable bones.
IMPRESSION: 1. 1.2 cm right renal calculus, increased in size since [DATE]. Mildly prominent stool.

## 2023-02-12 IMAGING — CR DG ABDOMEN 1V
1 series · 2 of 2 positions shown · non-contrast
Comparison: Radiograph 04/22/2021.  CT 04/08/2021

CLINICAL DATA: Kidney stone. Patient reports left-sided flank pain.

EXAM:
ABDOMEN - 1 VIEW

[Series 1: dg abd 1 view · 0.14mm/px · 2 of 2 slices shown]
[im 1/2]
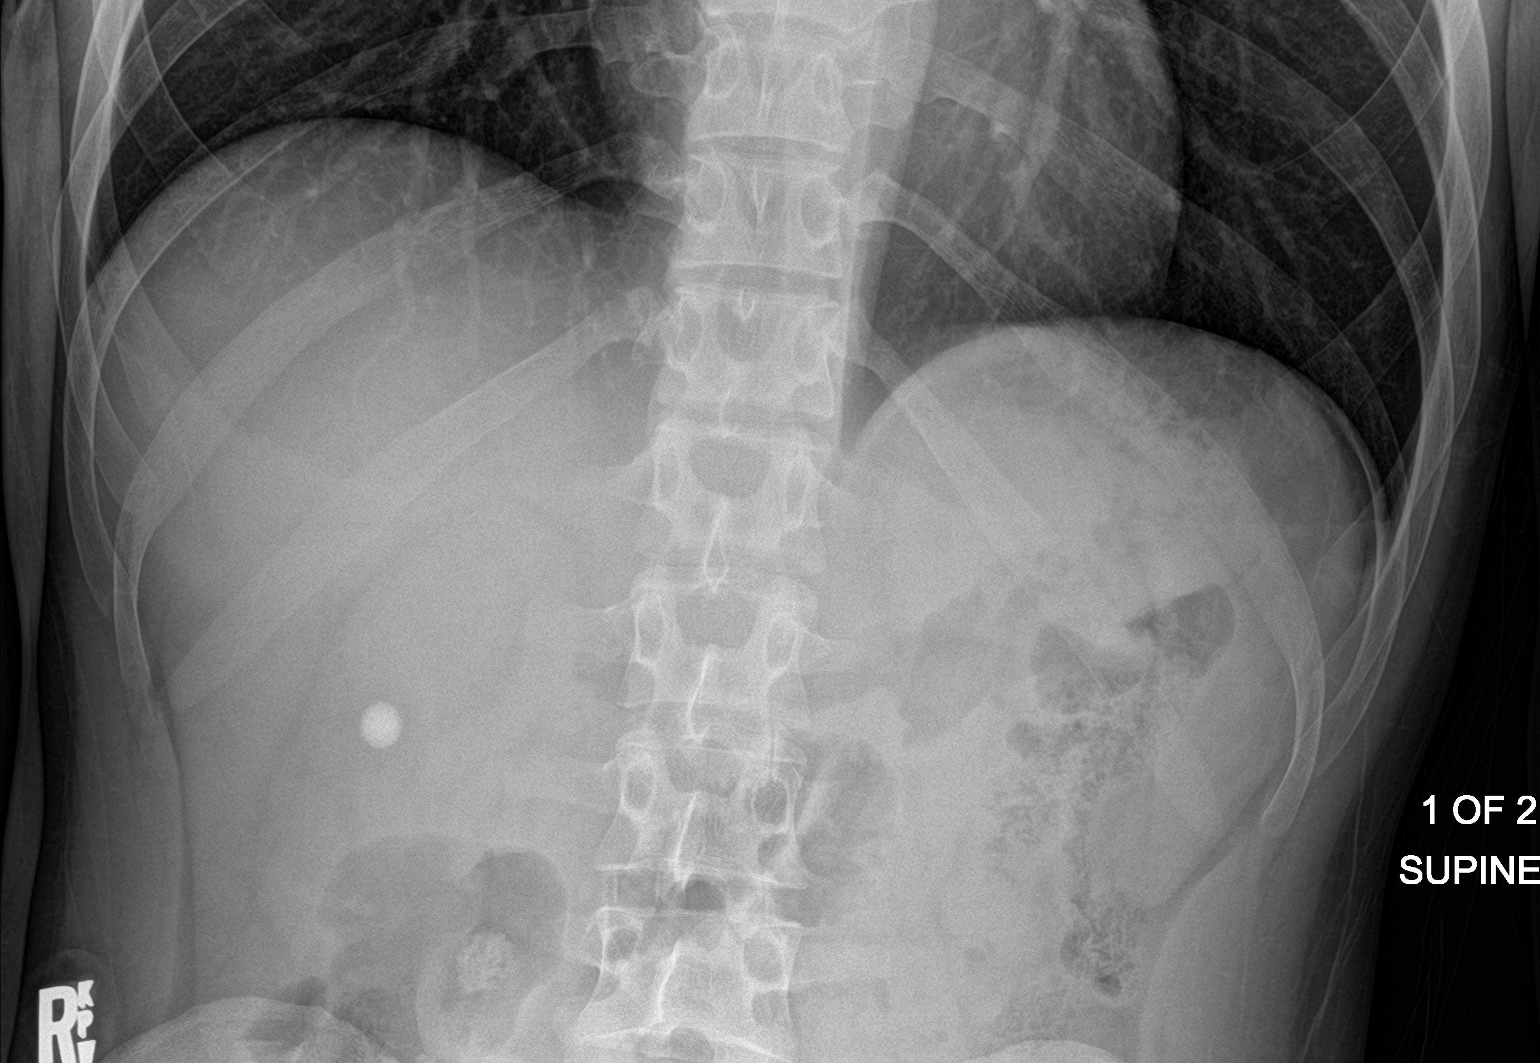
[im 2/2]
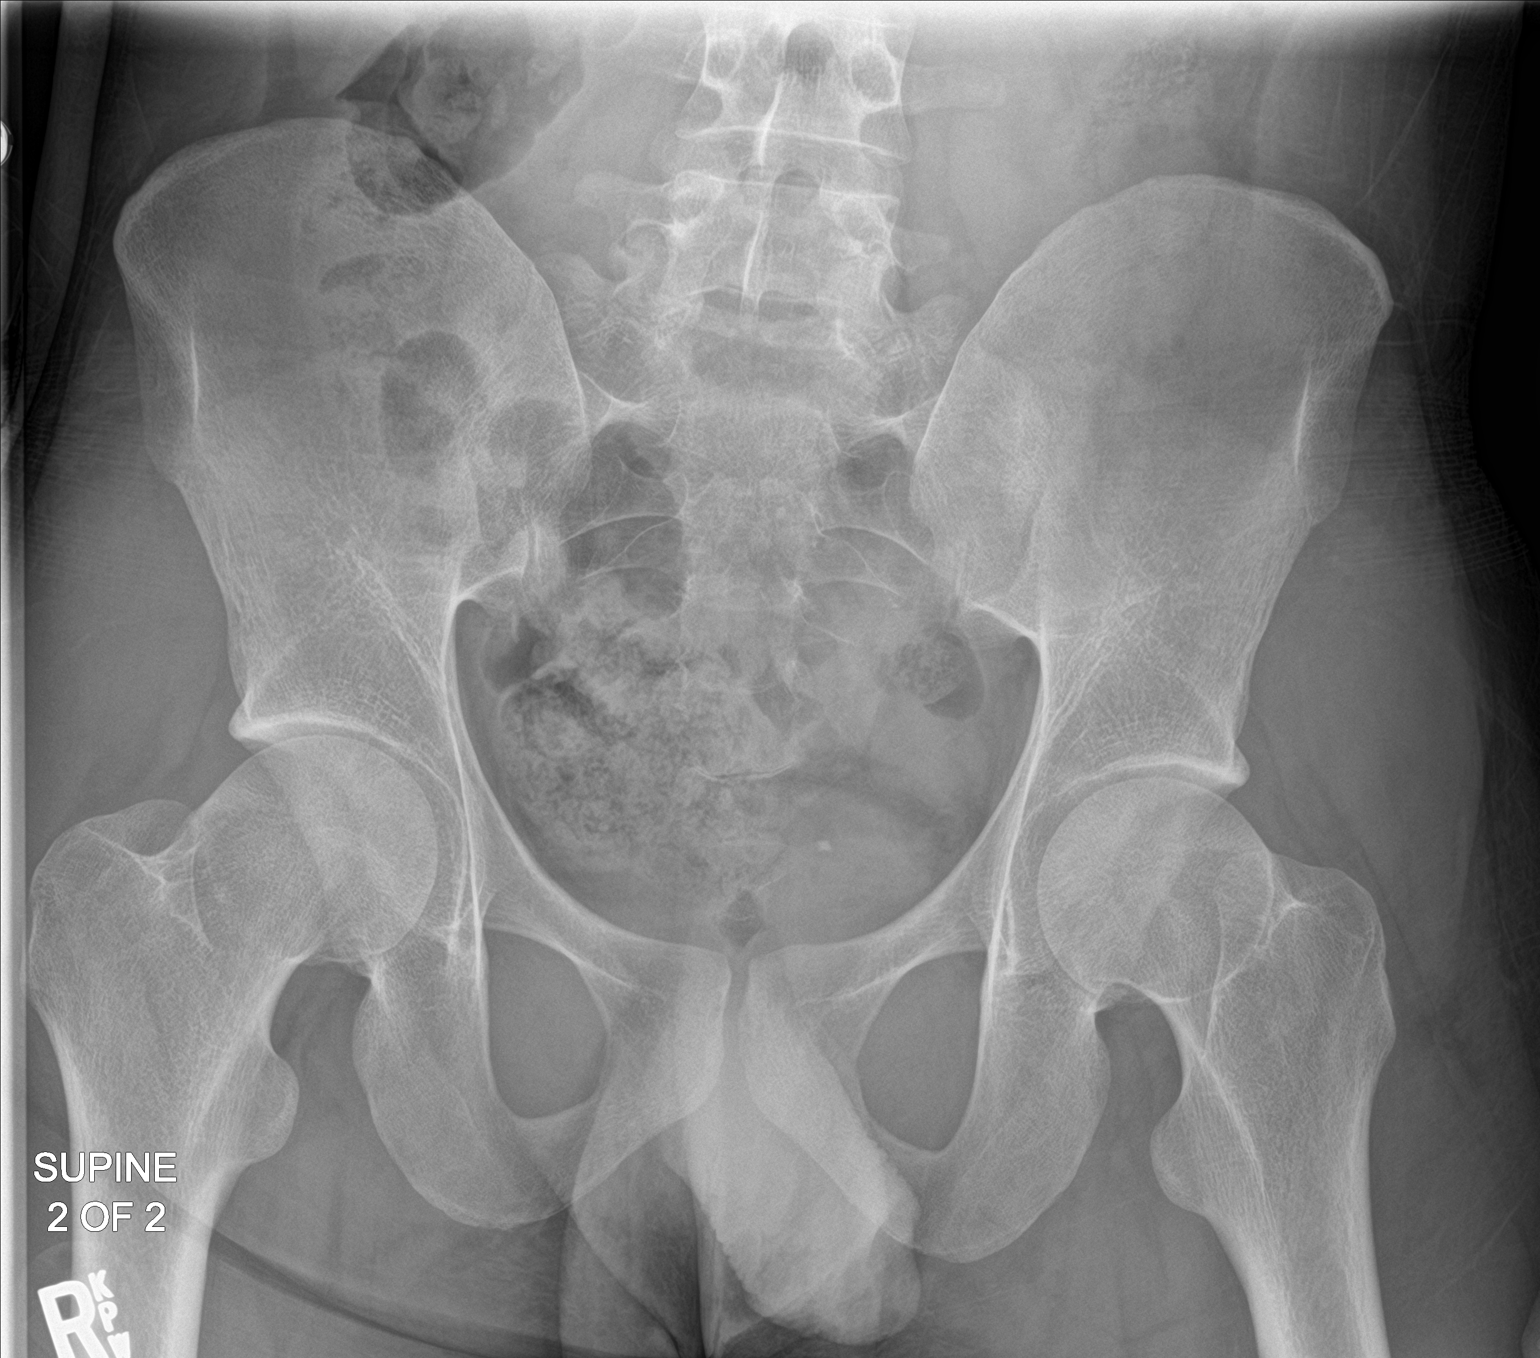

[2 of 2 positions shown; findings below may reference images not displayed]

FINDINGS: Unchanged 12 mm right renal stone. There is a 3 mm calcification in
the left pelvis that was not definitively seen on prior exams, and
may represent progression of prior ureteral stone. No other
urolithiasis. Stable right pelvic calcifications typically
phleboliths. Normal bowel gas pattern with small volume of colonic
stool. Lung bases are clear. No osseous abnormalities are seen.
IMPRESSION: 1. Unchanged 12 mm right renal stone.
2. A 3 mm calcification in the left pelvis was not definitively seen
on prior exams, and may represent progression of prior ureteral
stone.

## 2023-02-13 IMAGING — CR DG ABDOMEN 1V
1 series · 2 of 2 positions shown · non-contrast
Comparison: 05/05/2021

CLINICAL DATA: Left ureteral stone

EXAM:
ABDOMEN - 1 VIEW

[Series 1: dg abd 1 view · 0.14mm/px · 2 of 2 slices shown]
[im 1/2]
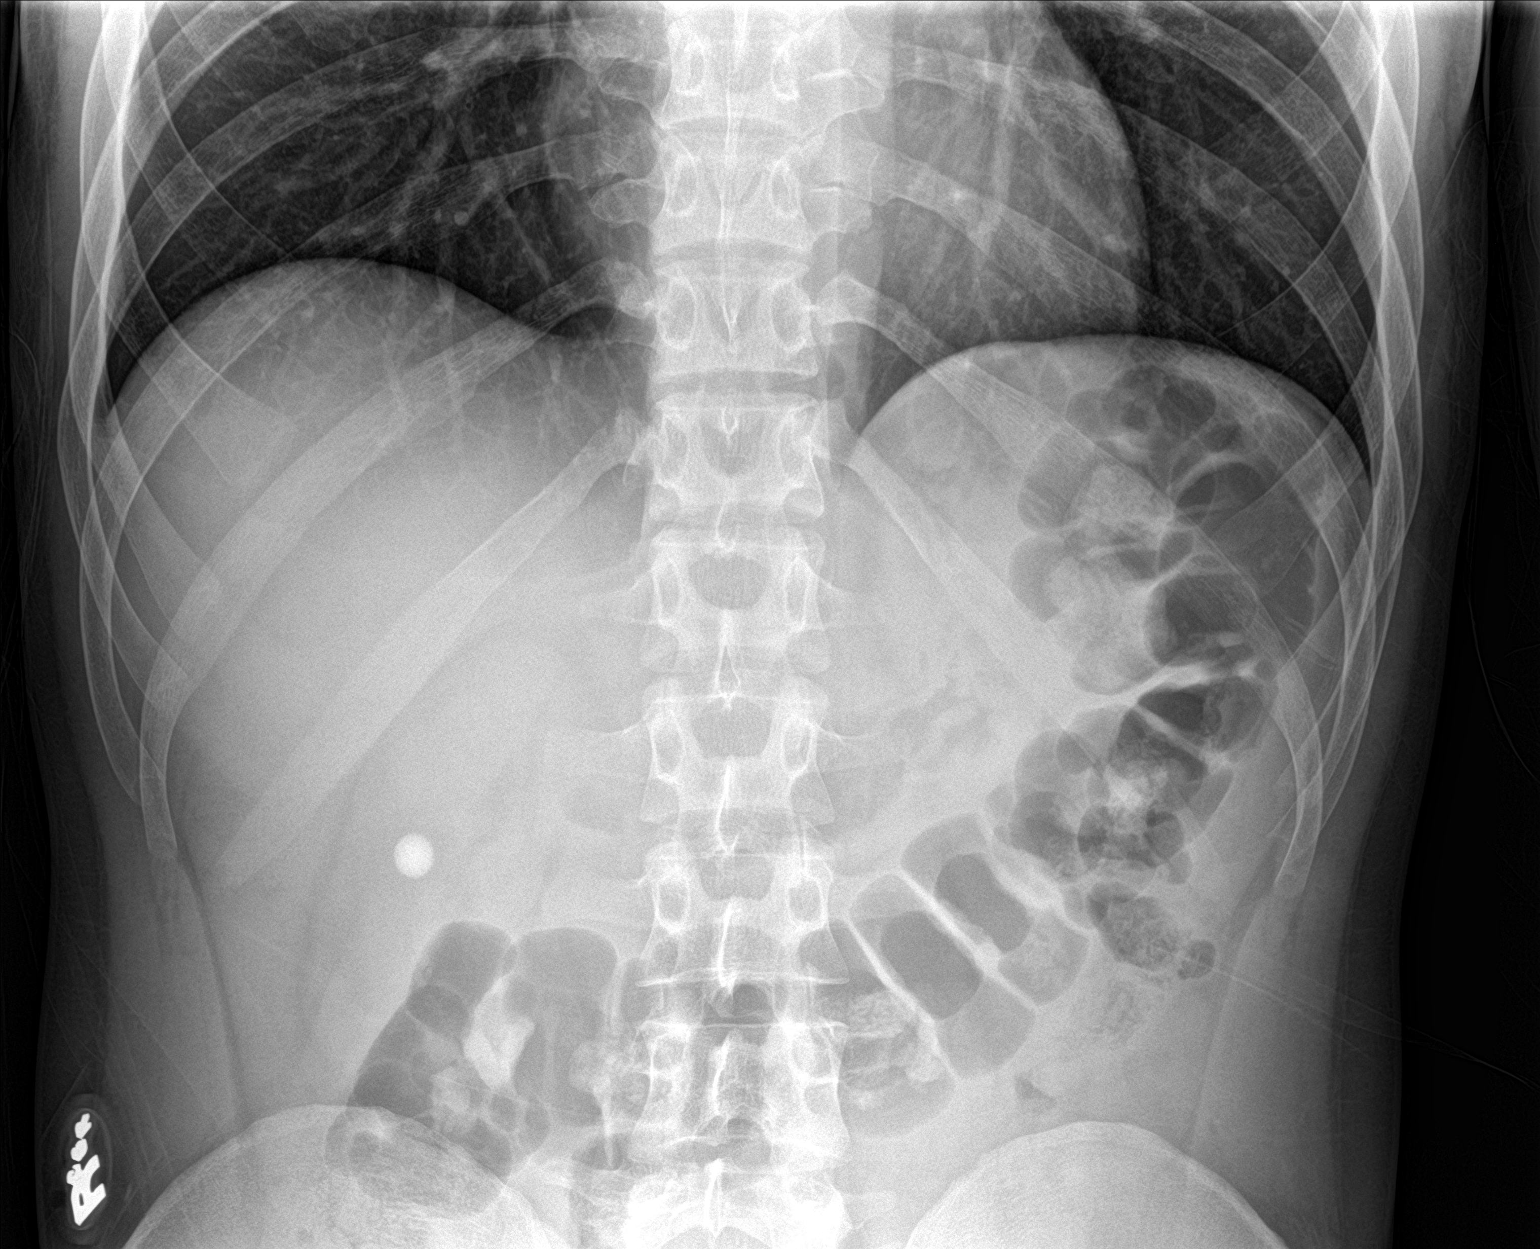
[im 2/2]
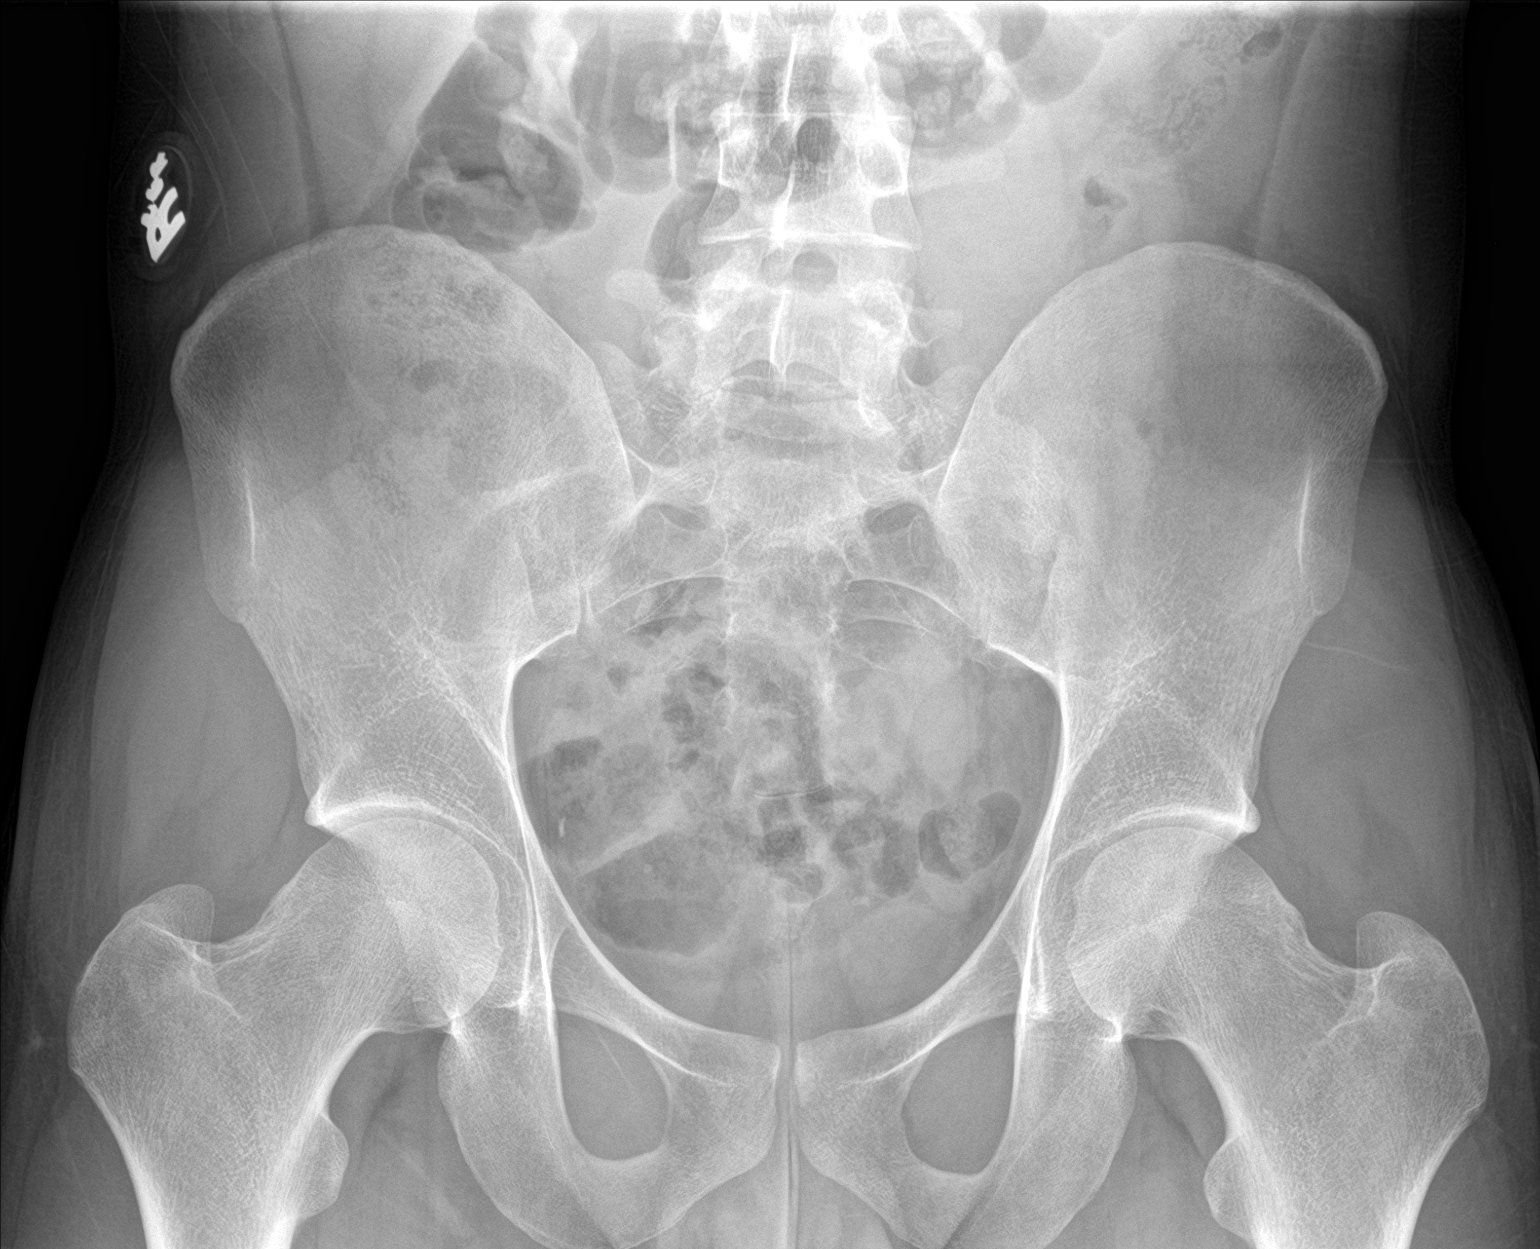

[2 of 2 positions shown; findings below may reference images not displayed]

FINDINGS: The bowel gas pattern is normal. Unchanged 12 mm stone projecting
within the mid right kidney. 3 mm calcification is again seen in the
lower left hemipelvis, unchanged. No acute osseous findings.
IMPRESSION: 1. Unchanged 12 mm right renal calculus.
2. Stable 3 mm left pelvic calcification.

## 2023-03-14 IMAGING — CR DG ABDOMEN 1V
1 series · 3 of 3 positions shown · non-contrast
Comparison: 05/06/2021.  CT 04/08/2021.

CLINICAL DATA: History of right renal stone.  Follow-up.

EXAM:
ABDOMEN - 1 VIEW

[Series 1: dg abd 1 view · 0.14mm/px · 3 of 3 slices shown]
[im 1/3]
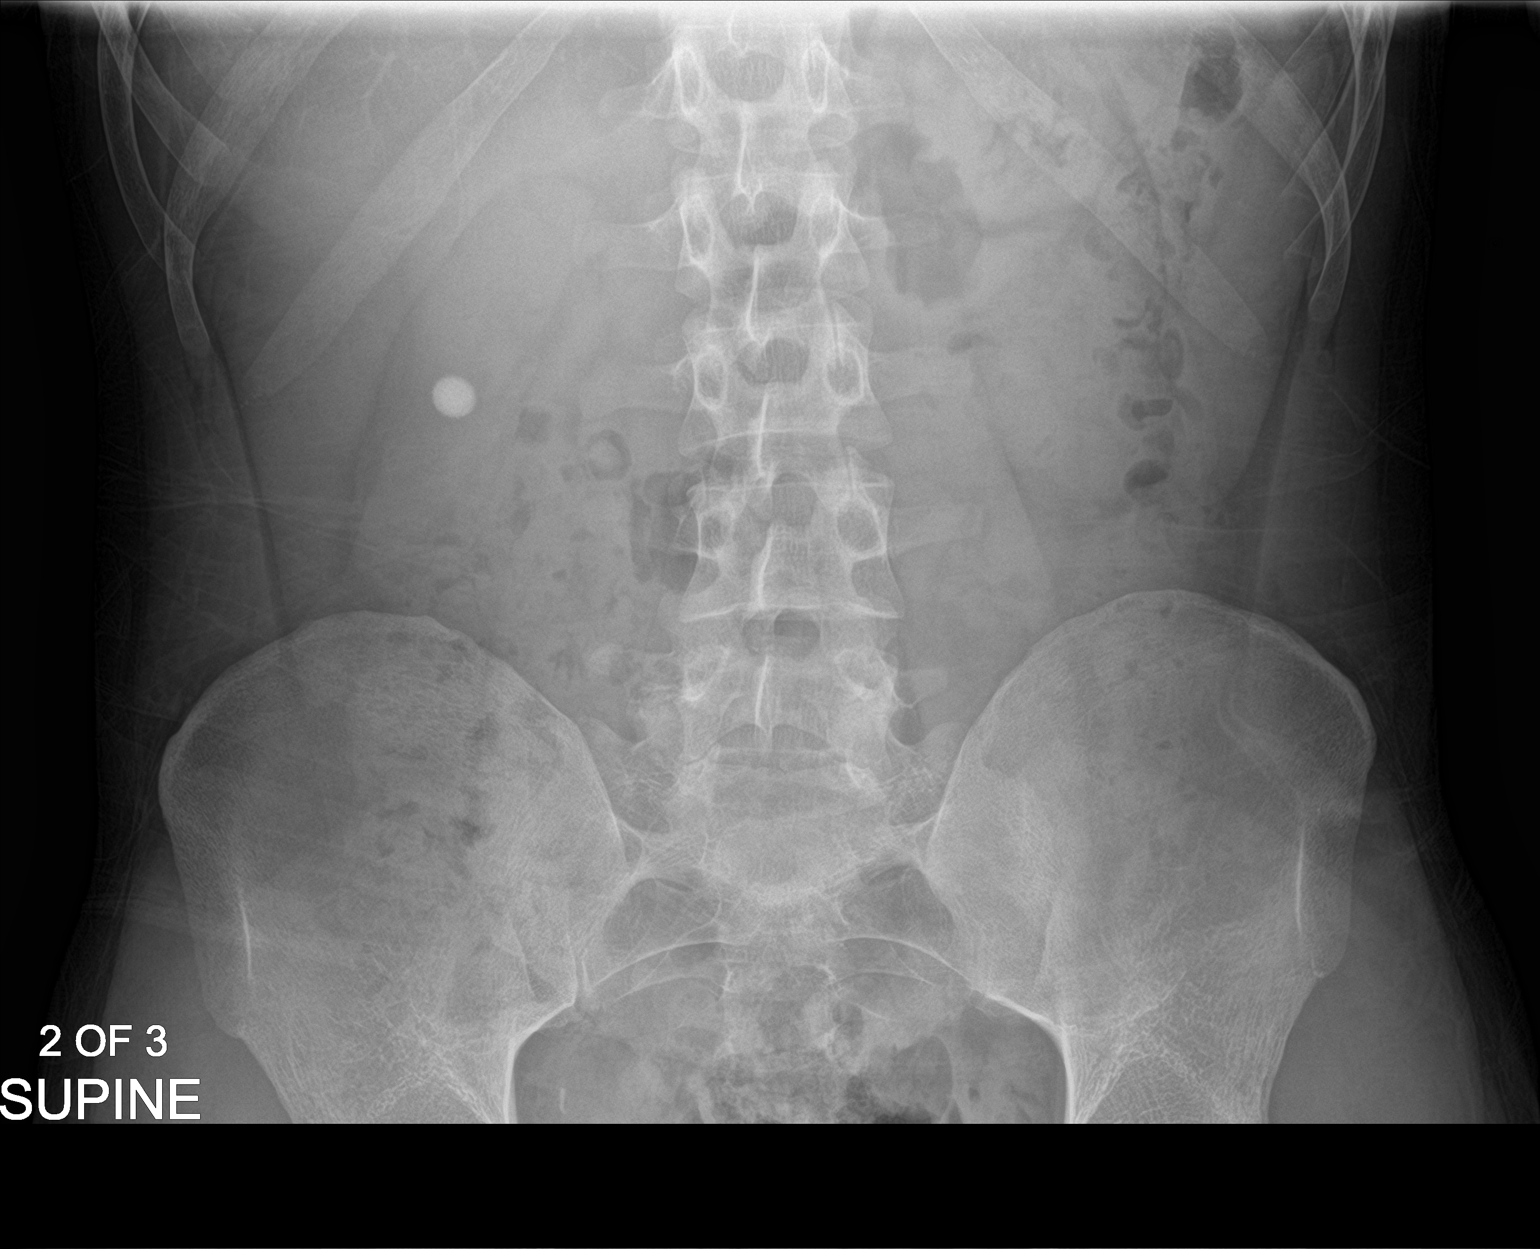
[im 2/3]
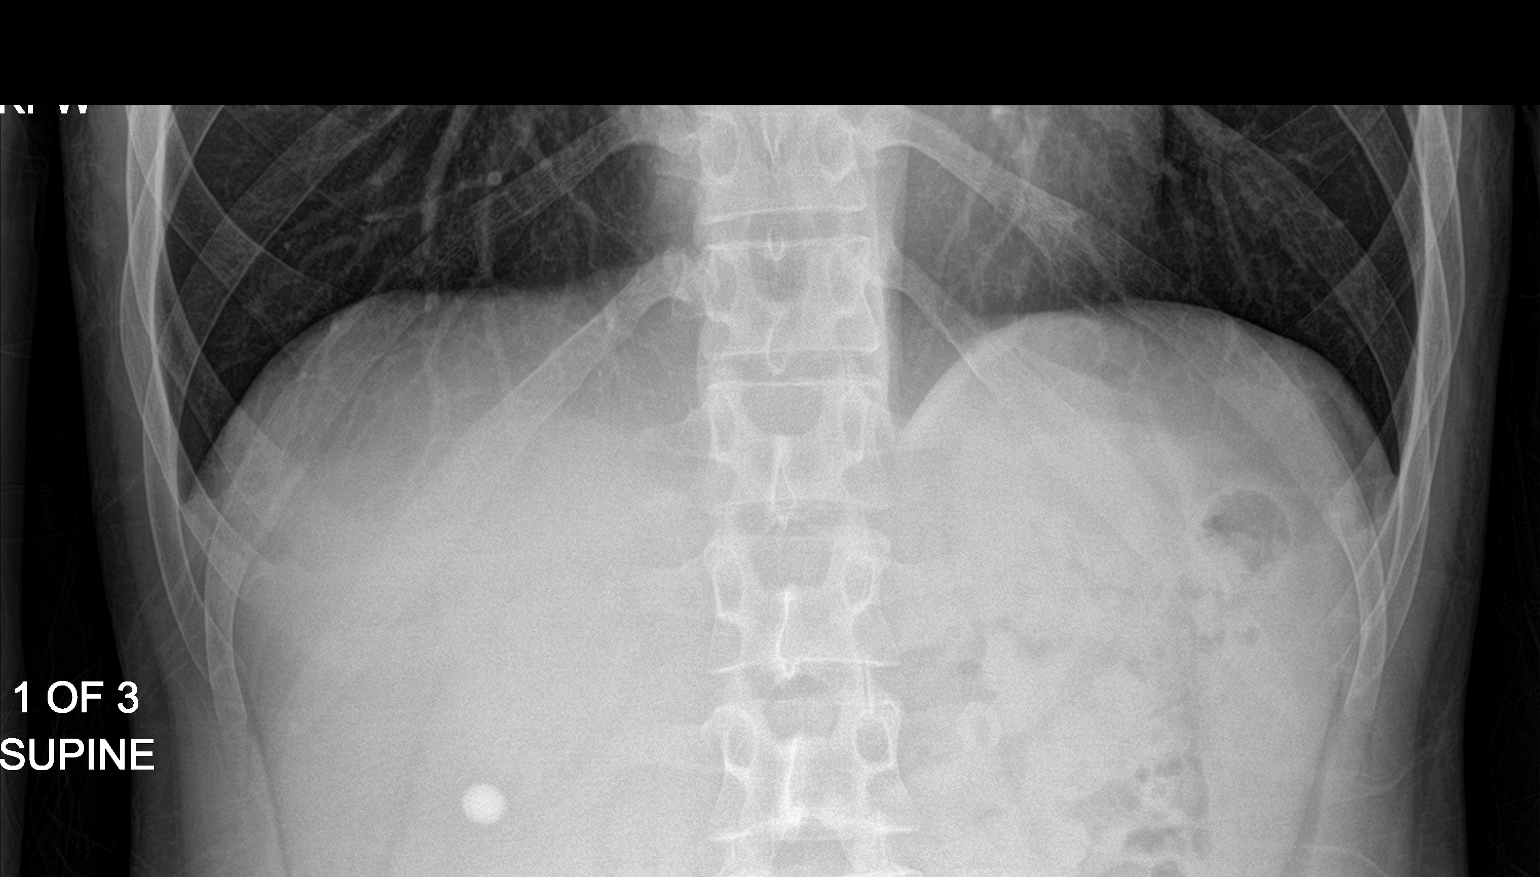
[im 3/3]
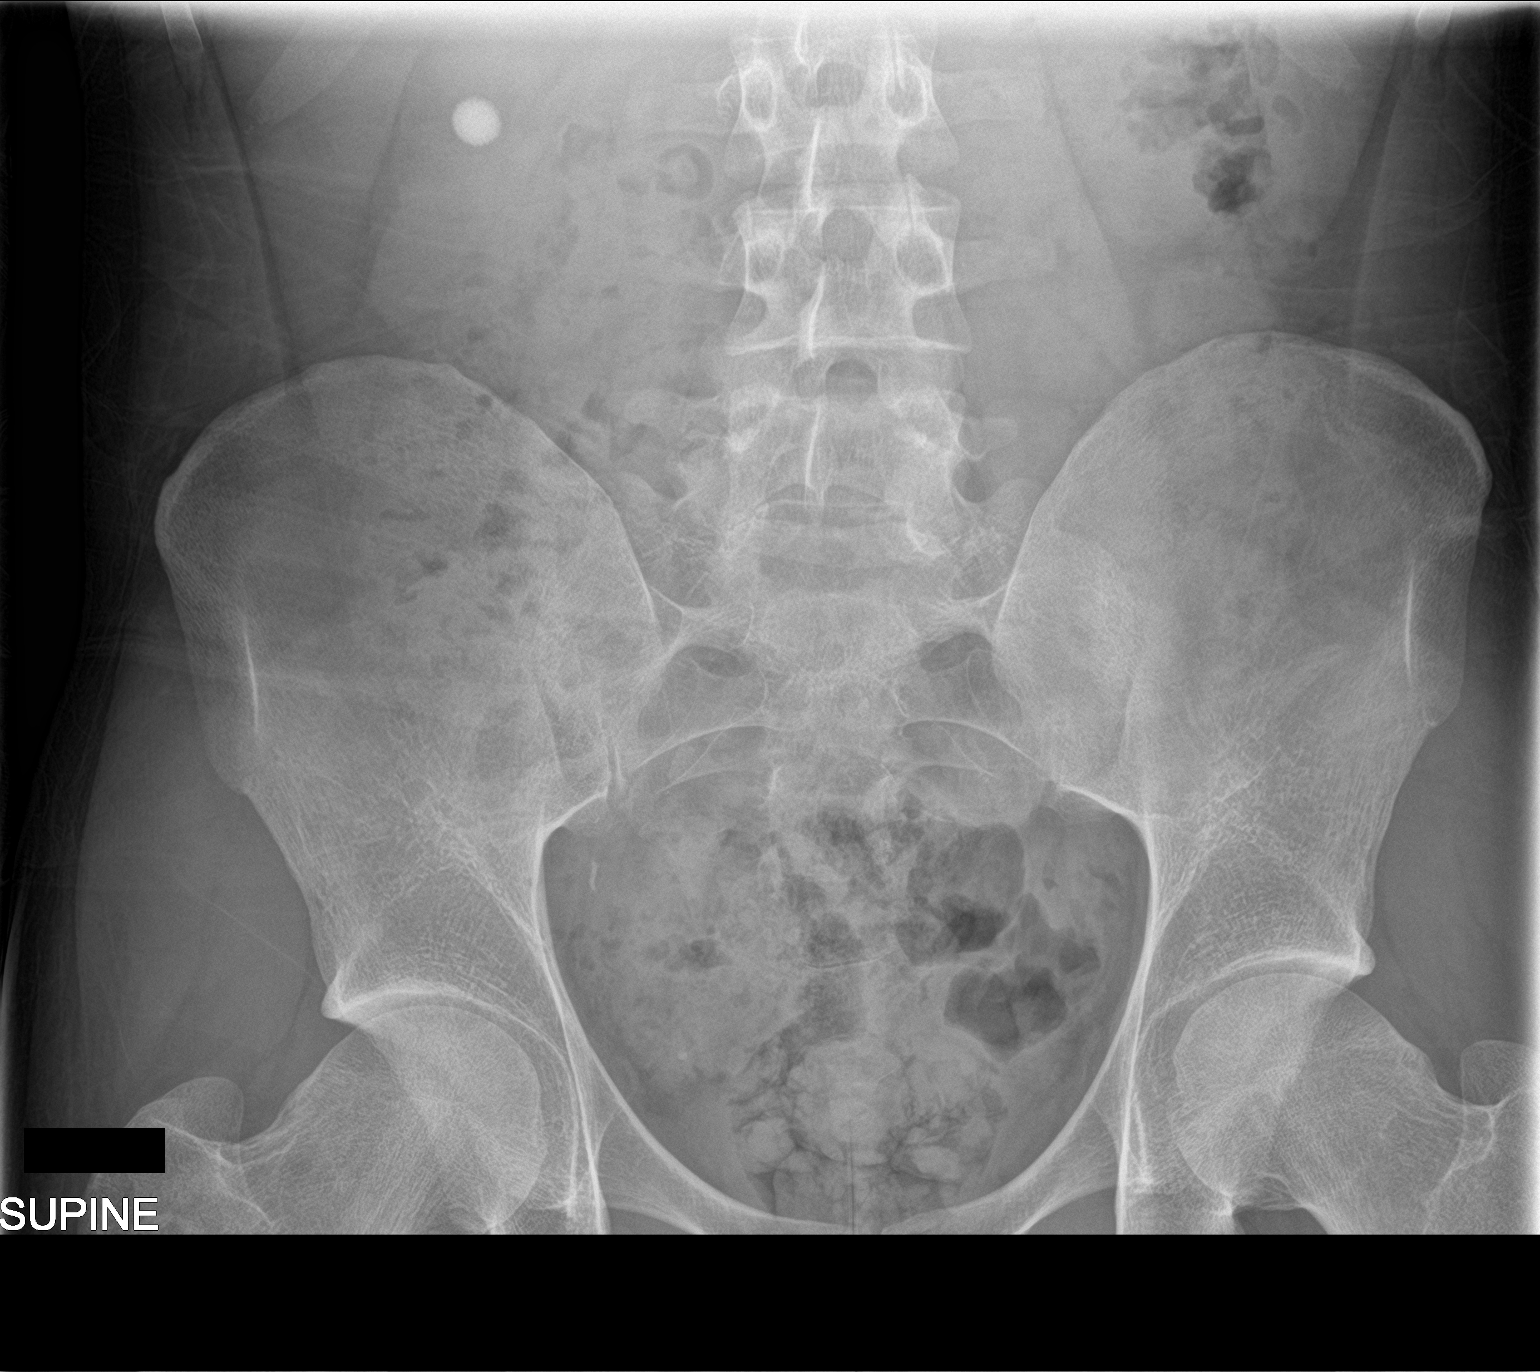

[3 of 3 positions shown; findings below may reference images not displayed]

FINDINGS: Bowel gas pattern is normal. 11 mm stone within the right kidney
appears unchanged. No evidence of passing stone. Few small
phleboliths in the right pelvis. No significant bone finding.
IMPRESSION: 11 mm stone within the right kidney, unchanged since prior exams.

## 2023-08-07 ENCOUNTER — Ambulatory Visit
Admission: EM | Admit: 2023-08-07 | Discharge: 2023-08-07 | Disposition: A | Discharge status: Home / Self Care | Payer: No Typology Code available for payment source | Attending: Emergency Medicine | Admitting: Emergency Medicine

## 2023-08-07 DIAGNOSIS — L0231 Cutaneous abscess of buttock: Secondary | ICD-10-CM

## 2023-08-07 MED ORDER — DOXYCYCLINE HYCLATE 100 MG PO CAPS: 100.0000 mg | ORAL_CAPSULE | Freq: Two times a day (BID) | ORAL | 0 refills | Status: AC

## 2023-08-07 NOTE — ED Triage Notes (Signed)
Patient to Urgent Care with complaints of abscess present to left glute. Has had some drainage.  Symptoms started over the last two days it has increased in size/ started to drain.

## 2023-08-07 NOTE — Discharge Instructions (Addendum)
Take the doxycycline as directed.  Keep the wound clean and dry.  Wash it twice a day with soap and water.  Then apply a topical antibiotic ointment and bandage.    Follow-up with your primary care provider.

## 2023-08-07 NOTE — ED Provider Notes (Signed)
Richard Ball    CSN: [id number] Arrival date & time: 08/07/23  1050      History   Chief Complaint Chief Complaint  Patient presents with   Abscess    HPI Richard Ball is a 22 y.o. male.  Patient presents with an abscess on his left buttock x 3 days.  The abscess is open and draining purulent drainage.  No fever, chills, or other symptoms.  Treating at home with topical antibiotic ointment and bandage.    The history is provided by the patient and medical records.    Past Medical History:  Diagnosis Date   Asperger syndrome    Kidney stones     Patient Active Problem List   Diagnosis Date Noted   Asperger's syndrome 05/03/2021    Past Surgical History:  Procedure Laterality Date   ATRIAL FIBRILLATION ABLATION     EXTRACORPOREAL SHOCK WAVE LITHOTRIPSY Left 05/06/2021   Procedure: EXTRACORPOREAL SHOCK WAVE LITHOTRIPSY (ESWL);  Surgeon: Riki Altes, MD;  Location: [facility] OR[facility];  Service: Urology;  Laterality: Left;       Home Medications    Prior to Admission medications   Medication Sig Start Date End Date Taking? Authorizing Provider  doxycycline (VIBRAMYCIN) 100 MG capsule Take 1 capsule (100 mg total) by mouth 2 (two) times daily for 7 days. 08/07/23 08/14/23 Yes Mickie Bail, NP    Family History History reviewed. No pertinent family history.  Social History Social History   Tobacco Use   Smoking status: Never   Smokeless tobacco: Never  Substance Use Topics   Alcohol use: Not Currently     Allergies   Patient has no known allergies.   Review of Systems Review of Systems  Constitutional:  Negative for chills and fever.  Musculoskeletal:  Negative for arthralgias, gait problem and joint swelling.  Skin:  Positive for color change and wound.  Neurological:  Negative for weakness and numbness.     Physical Exam Triage Vital Signs ED Triage Vitals  Encounter Vitals Group     BP      Systolic BP Percentile       Diastolic BP Percentile      Pulse      Resp      Temp      Temp src      SpO2      Weight      Height      Head Circumference      Peak Flow      Pain Score      Pain Loc      Pain Education      Exclude from Growth Chart    No data found.  Updated Vital Signs BP 131/82   Pulse 98   Temp 98 F (36.7 C)   Resp 18   SpO2 97%   Visual Acuity Right Eye Distance:   Left Eye Distance:   Bilateral Distance:    Right Eye Near:   Left Eye Near:    Bilateral Near:     Physical Exam Constitutional:      General: He is not in acute distress. HENT:     Mouth/Throat:     Mouth: Mucous membranes are moist.  Cardiovascular:     Rate and Rhythm: Normal rate and regular rhythm.  Pulmonary:     Effort: Pulmonary effort is normal. No respiratory distress.  Musculoskeletal:        General: No deformity. Normal range of motion.  Skin:    General: Skin is warm and dry.     Findings: Erythema and lesion present.     Comments: Left upper buttock: 2 cm x 2 cm area of induration with central open lesion with scant purulent drainage.   Neurological:     General: No focal deficit present.     Mental Status: He is alert and oriented to person, place, and time.     Sensory: No sensory deficit.     Motor: No weakness.     Gait: Gait normal.  Psychiatric:        Mood and Affect: Mood normal.        Behavior: Behavior normal.      UC Treatments / Results  Labs (all labs ordered are listed, but only abnormal results are displayed) Labs Reviewed - No data to display  EKG   Radiology No results found.  Procedures Procedures (including critical care time)  Medications Ordered in UC Medications - No data to display  Initial Impression / Assessment and Plan / UC Course  I have reviewed the triage vital signs and the nursing notes.  Pertinent labs & imaging results that were available during my care of the patient were reviewed by me and considered in my medical decision  making (see chart for details).    Skin abscess of buttock.  No I&D indicated as the wound is open and draining.  Treating today with doxycycline.  Wound care instructions and signs of worsening infection discussed.  Education provided on skin abscess.  Instructed patient to follow-up with his PCP.  He agrees to plan of care.  Final Clinical Impressions(s) / UC Diagnoses   Final diagnoses:  Abscess of left buttock     Discharge Instructions      Take the doxycycline as directed.  Keep the wound clean and dry.  Wash it twice a day with soap and water.  Then apply a topical antibiotic ointment and bandage.    Follow-up with your primary care provider.     ED Prescriptions     Medication Sig Dispense Auth. Provider   doxycycline (VIBRAMYCIN) 100 MG capsule Take 1 capsule (100 mg total) by mouth 2 (two) times daily for 7 days. 14 capsule Mickie Bail, NP      PDMP not reviewed this encounter.   Mickie Bail, NP 08/07/23 1135

## 2024-05-22 ENCOUNTER — Other Ambulatory Visit: Payer: Self-pay | Admitting: Urology

## 2024-05-22 DIAGNOSIS — N200 Calculus of kidney: Secondary | ICD-10-CM

## 2024-05-22 NOTE — Progress Notes (Signed)
    05/23/2024 7:56 PM   Richard Ball 2001/03/26 [id number]  Referring provider: Glover Lenis, MD [phone] [place],  [place] [address]  Urological history: 1.  Nephrolithiasis -stone composition 80% hydroxyapatite and 20% calcium oxalate monohydrate -first stone occurrence at age 23  -persistent right renal 1 cm stone on KUB  Chief Complaint  Patient presents with   Follow-up   Nephrolithiasis   HPI: Richard Ball is a 23 y.o. man who presents today for back pain.  Previous records reviewed.   He has had left sided pain that has been intermittent for almost two weeks.   It may get worse after eating, but he is not sure.  Patient denies any modifying or aggravating factors.  Patient denies any recent UTI's, gross hematuria, dysuria or suprapubic/flank pain.  Patient denies any fevers, chills, nausea or vomiting.    Serum creatinine of 0.9 with a EGFR 122 in March.  UA positive for 6-10 WBC's, moderate bacteria, mucus threads present, amorphous sediment present   KUB 12 mm right renal stone   PMH: Past Medical History:  Diagnosis Date   Asperger syndrome    Kidney stones     Surgical History: Past Surgical History:  Procedure Laterality Date   ATRIAL FIBRILLATION ABLATION     EXTRACORPOREAL SHOCK WAVE LITHOTRIPSY Left 05/06/2021   Procedure: EXTRACORPOREAL SHOCK WAVE LITHOTRIPSY (ESWL);  Surgeon: Twylla Glendia BROCKS, MD;  Location: [facility];  Service: Urology;  Laterality: Left;    Home Medications:  Allergies as of 05/23/2024   No Known Allergies      Medication List    as of May 23, 2024 11:59 PM   You have not been prescribed any medications.     Allergies: No Known Allergies  Family History: No family history on file.  Social History: See HPI for pertinent social history  ROS: Pertinent ROS in HPI  Physical Exam: BP 123/86 (BP Location: Left Arm, Patient Position: Sitting, Cuff Size: Normal)   Pulse 81   Ht 6' (1.829  m)   Wt 159 lb (72.1 kg)   SpO2 98%   BMI 21.56 kg/m   Constitutional:  Well nourished. Alert and oriented, No acute distress. HEENT: [place] AT, moist mucus membranes.  Trachea midline Cardiovascular: No clubbing, cyanosis, or edema. Respiratory: Normal respiratory effort, no increased work of breathing. Neurologic: Grossly intact, no focal deficits, moving all 4 extremities. Psychiatric: Normal mood and affect.  Laboratory Data: See EPIC and HPI  I have reviewed the labs.   Pertinent Imaging: See HPI and EPIC  I have independently reviewed the films.    Assessment & Plan:    1. Nephrolithiasis - UA w/o micro heme - KUB w/ a stable right renal stone - explained that there were no left sided stones appreciated on today's KUB  - encourage patient to follow up with PCP for other etiology for his discomfort   Return if symptoms worsen or fail to improve.  These notes generated with voice recognition software. I apologize for typographical errors.  CLOTILDA HELON RIGGERS  [facility] [address]  [address] [place], [place] [address] [phone]

## 2024-05-23 ENCOUNTER — Ambulatory Visit
Admission: RE | Admit: 2024-05-23 | Discharge: 2024-05-23 | Disposition: A | Discharge status: Home / Self Care | Source: Ambulatory Visit | Attending: Urology | Admitting: Urology

## 2024-05-23 ENCOUNTER — Ambulatory Visit: Admitting: Urology

## 2024-05-23 VITALS — BP 123/86 | HR 81 | Ht 72.0 in | Wt 159.0 lb

## 2024-05-23 DIAGNOSIS — N200 Calculus of kidney: Secondary | ICD-10-CM

## 2024-05-23 LAB — URINALYSIS, COMPLETE
Bilirubin, UA: NEGATIVE
Glucose, UA: NEGATIVE
Ketones, UA: NEGATIVE
Nitrite, UA: NEGATIVE
Protein,UA: NEGATIVE
RBC, UA: NEGATIVE
Specific Gravity, UA: 1.015 (ref 1.005–1.030)
Urobilinogen, Ur: 0.2 mg/dL (ref 0.2–1.0)
pH, UA: 7 (ref 5.0–7.5)

## 2024-05-23 LAB — MICROSCOPIC EXAMINATION

## 2024-05-27 ENCOUNTER — Encounter: Payer: Self-pay | Admitting: Urology
